# Patient Record
Sex: Female | Born: 1970 | Race: White | Hispanic: No | Marital: Married | Smoking: Never smoker
Health system: Southern US, Community
[De-identification: ages and names within clinical notes are randomized; demographics above are authoritative.]

## PROBLEM LIST (undated history)

## (undated) DIAGNOSIS — D6851 Activated protein C resistance: Secondary | ICD-10-CM

## (undated) DIAGNOSIS — I639 Cerebral infarction, unspecified: Secondary | ICD-10-CM

## (undated) HISTORY — PX: BRAIN SURGERY: SHX531

## (undated) HISTORY — PX: CHOLECYSTECTOMY: SHX55

## (undated) HISTORY — PX: ABDOMINAL SURGERY: SHX537

---

## 2016-01-05 DIAGNOSIS — G4733 Obstructive sleep apnea (adult) (pediatric): Secondary | ICD-10-CM | POA: Insufficient documentation

## 2018-10-21 ENCOUNTER — Telehealth: Payer: Self-pay

## 2018-10-21 ENCOUNTER — Other Ambulatory Visit: Payer: Self-pay

## 2018-10-21 MED ORDER — ESCITALOPRAM OXALATE 20 MG PO TABS: 30.0000 mg | ORAL_TABLET | Freq: Every day | ORAL | 5 refills | Status: DC

## 2018-10-21 NOTE — Telephone Encounter (Signed)
[facility] requesting refill for pt's lexapro 20mg  1.5 tablets daily.   Confirmed with pt a change in pharmacy. Will submit

## 2019-02-24 ENCOUNTER — Encounter: Payer: Self-pay | Admitting: Psychiatry

## 2019-02-24 ENCOUNTER — Other Ambulatory Visit: Payer: Self-pay

## 2019-02-24 ENCOUNTER — Ambulatory Visit (INDEPENDENT_AMBULATORY_CARE_PROVIDER_SITE_OTHER): Payer: 59 | Admitting: Psychiatry

## 2019-02-24 DIAGNOSIS — F3132 Bipolar disorder, current episode depressed, moderate: Secondary | ICD-10-CM | POA: Diagnosis not present

## 2019-02-24 DIAGNOSIS — F411 Generalized anxiety disorder: Secondary | ICD-10-CM | POA: Insufficient documentation

## 2019-02-24 DIAGNOSIS — F88 Other disorders of psychological development: Secondary | ICD-10-CM | POA: Diagnosis not present

## 2019-02-24 DIAGNOSIS — G4733 Obstructive sleep apnea (adult) (pediatric): Secondary | ICD-10-CM

## 2019-02-24 MED ORDER — CARIPRAZINE HCL 3 MG PO CAPS: 3.0000 mg | ORAL_CAPSULE | Freq: Every day | ORAL | 5 refills | Status: DC

## 2019-02-24 MED ORDER — ESCITALOPRAM OXALATE 20 MG PO TABS: 20.0000 mg | ORAL_TABLET | Freq: Every day | ORAL | 5 refills | Status: DC

## 2019-02-24 MED ORDER — LAMOTRIGINE 200 MG PO TABS: 400.0000 mg | ORAL_TABLET | Freq: Every day | ORAL | 5 refills | Status: DC

## 2019-02-24 MED ORDER — BUPROPION HCL ER (XL) 150 MG PO TB24: 450.0000 mg | ORAL_TABLET | Freq: Every day | ORAL | 5 refills | Status: DC

## 2019-02-24 NOTE — Progress Notes (Signed)
[facility]  Patient ID: Madison RheaJodie Henry,  MRN: [id number]  PCP: Patient, No Pcp Per  Date of Evaluation: 02/24/2019 Time spent:20 minutes from 1650 to 1710  Chief Complaint:  Chief Complaint    Depression; Manic Behavior; Anxiety; Fatigue; Obesity      HISTORY/CURRENT STATUS: [name] is provided telemedicine audiovisual appointment session individually at her home residence with consent without collateral in 3628-month evaluation and management of bipolar disorder, generalized anxiety, and neurodevelopmental sequela of neonatal stroke.  She continues to fixate her care in patterns established by Dr. Tomasa Randunningham 10 years ago so that she will only appoint at the office every 9 months.  She has been prescribed Abilify since 06/03/2009 after lack of success with Risperdal.  Abilify has maintained good relief for extremes of mania of which patient is most fearful past consequences manic romantic and risk-taking behavior.  However in the last 2 years, the patient has been more slowed, inactive, gaining weight, low energy, and low interest.  She does not necessarily interpret this as depressed clinically while side effects and depression are evident.  Husband and daughter now confront her to have active enthusiasm and participation in preparing for move to their new home in a couple of weeks.  Patient now requests change in treatment for these problems she has resisted in several years of care by myself.  She did relinquish fixations about treatment 9 months ago by allowing Abilify to be reduced 50% from 30 to 15 mg every morning.  High dosing of medication had been confronted several years attempting to gain the patient's active participation in matching treatment to symptoms for optimal efficacy with least side effects.  She appears to have dopaminergic blunting and slowing of mentation and activity for impoverished more than depressed affect though most both are important.  High-dose  Wellbutrin, Lamictal, Lexapro and as needed Valium do not realize side effects even now after the 50% reduction in Abilify. She apologizes for requesting in the session today to change her medications to help her function actively in the family again patient is a move to a new house.  Sweet tea is her only source of caffeine.  She no longer uses any alcohol and has no history of tobacco or other drugs.  She sees ChiropodistHeather Mask for therapy every 2 weeks on Wednesday that she always considered being blah better than angry but now realizes how she is limited.  She has no current mania, psychosis, suicidality, or delirium.  Depression       The patient presents with depression.  This is a recurrent problem.  The current episode started more than 1 year ago.   The onset quality is sudden.   The problem occurs intermittently.  The problem has been waxing and waning since onset.  Associated symptoms include decreased concentration, fatigue, hopelessness, insomnia, irritable, decreased interest, appetite change, myalgias and sad.     The symptoms are aggravated by social issues, family issues, medication and work stress.  Past treatments include SSRIs - Selective serotonin reuptake inhibitors, other medications and psychotherapy.  Compliance with treatment is good.  Past compliance problems include medical issues, medication issues and difficulty with treatment plan.  Previous treatment provided moderate relief.  Risk factors include family history, major life event, stress and a recent illness.   Past medical history includes physical disability, anxiety, bipolar disorder, depression and mental health disorder.     Pertinent negatives include no chronic illness, no recent illness, no life-threatening condition, no recent psychiatric admission,  no eating disorder, no obsessive-compulsive disorder, no post-traumatic stress disorder, no schizophrenia and no head trauma.   Individual Medical History/ Review of Systems:  Changes? :  Yes she gained 20 pounds approximately 3-1/2 years ago.  Curiously [place] registry does not list any Valium or Ambien from the past.  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  Dr. Tomasa Rand at first appointment noted that she had anxiety and depression as long as she can remember treated with Effexor, BuSpar,, and Ambien.  Subsequent medications from Dr. Tomasa Rand have included Prozac, Depakote, and Risperdal before occasions were well-established by 2011, Abilify reaching 30 mg daily by 2013 reduced to 15 mg 06/17/2018.  Obstructive sleep apnea requires CPAP.  She has had radiculopathies requiring neurology multiple times.This winter she had urolithiasis.  Cholesterol was 212 with LDL cholesterol 133 upper limit of normal 130 mg/dl on 1/61/0960 and maximum value in the last 5 years 147 mg/dL.  Allergies: Patient has no known allergies.  Current Medications:  Current Outpatient Medications:  .  ARIPiprazole (ABILIFY) 15 MG tablet, Take 7.5 mg by mouth daily after breakfast. For 2 weeks then discontinue, Disp: , Rfl:  .  buPROPion (WELLBUTRIN XL) 150 MG 24 hr tablet, Take 3 tablets (450 mg total) by mouth daily after breakfast. 2 weeks then discontinue 03/11/2019, Disp: 90 tablet, Rfl: 5 .  diazepam (VALIUM) 5 MG tablet, Take 5 mg by mouth 2 (two) times daily as needed for anxiety or agitation., Disp: , Rfl:  .  escitalopram (LEXAPRO) 20 MG tablet, Take 1 tablet (20 mg total) by mouth daily after breakfast., Disp: 30 tablet, Rfl: 5 .  lamoTRIgine (LAMICTAL) 200 MG tablet, Take 2 tablets (400 mg total) by mouth at bedtime., Disp: 60 tablet, Rfl: 5 .  cariprazine (VRAYLAR) capsule, Take 1 capsule (3 mg total) by mouth daily., Disp: 30 capsule, Rfl: 5   Medication Side Effects: confusion, dizziness/lightheadedness, fatigue/weakness, hypersomnolence and weight gain from Abilify while Risperdal caused pain all over.  Family Medical/ Social History: Changes? Yes  family history of depression, substance use, and cancer.  Family move to a new home now planned in several weeks.  MENTAL HEALTH EXAM:  There were no vitals taken for this visit.There is no height or weight on file to calculate BMI. Last appointment 06/17/2018 this office included weight 240 pounds, height 65 inches, BMI 39.9, BP 120/84 and HR 78.  General Appearance: Casual, Fairly Groomed, Guarded and Obese  Eye Contact:  Good  Speech:  Clear and Coherent, Normal Rate and Talkative  Volume:  Normal  Mood:  Anxious, Depressed, Dysphoric, Irritable, Worthless and History of manic episodes some sustained.  Affect:  Depressed, Labile, Full Range and Anxious  Thought Process:  Coherent, Goal Directed and Irrelevant  Orientation:  Full (Time, Place, and Person)  Thought Content: Obsessions and Rumination   Suicidal Thoughts:  No  Homicidal Thoughts:  No  Memory:  Immediate;   Good Remote;   Fair  Judgement:  Fair  Insight:  Fair  Psychomotor Activity:  Decreased and Mannerisms and psychomotor retardation  Concentration:  Concentration: Fair and Attention Span: Fair  Recall:  Good  Fund of Knowledge: Good  Language: Good  Assets:  Desire for Improvement Social Support Talents/Skills  ADL's:  Intact  Cognition: WNL  Prognosis:  Fair    DIAGNOSES:    ICD-10-CM   1. Bipolar I disorder, moderate, current or most recent episode depressed, with anxious distress (HCC) F31.32 cariprazine (VRAYLAR) capsule  escitalopram (LEXAPRO) 20 MG tablet    lamoTRIgine (LAMICTAL) 200 MG tablet    buPROPion (WELLBUTRIN XL) 150 MG 24 hr tablet  2. Generalized anxiety disorder F41.1 escitalopram (LEXAPRO) 20 MG tablet    lamoTRIgine (LAMICTAL) 200 MG tablet    buPROPion (WELLBUTRIN XL) 150 MG 24 hr tablet  3. OSA (obstructive sleep apnea) G47.33 buPROPion (WELLBUTRIN XL) 150 MG 24 hr tablet  4. Secondary neurodevelopmental disorder F88 lamoTRIgine (LAMICTAL) 200 MG tablet    buPROPion (WELLBUTRIN XL)  150 MG 24 hr tablet    Receiving Psychotherapy: Yes Heather Mask, LPC at [facility] every 2 weeks  RECOMMENDATIONS: Bipolar depression and generalized anxiety fixate patient in her fear of change of medications, and side effects of Abilify persist despite reduction of the dose 50% to a moderate dose.  She thereby today apologizes for asking for change in medication to facilitate her becoming a part of the family again and helping out with upcoming move to a new home.  Bipolar depression is more evident as Abilify is reduced but side effects persist though not as severe as on 30 mg last September.  Abilify is reduced again to 1/2 of 15 mg total 7.5 mg in morning for 2 weeks then discontinued.  Simultaneously, Vraylar is started 3 mg capsule as 1 every other morning for 3 doses then 1 every morning sent as #30 with [facility]ills to [facility] for bipolar 1 currently depressed with anxious distress history of manic episodes.  Lexapro is reduced 33% relative to poop out type side effect differential and for bipolar stabilization when attempting to stabilize generalized anxiety.  Lexapro is escribed 20 mg every morning #30 with 8 refills to [facility] and bipolar.  Wellbutrin 150 mg XL taking 3 tablets total 450 mg  XL every morning for bipolar depression, generalized anxiety, and sleep apnea is sent as #90 [facility]  She continues Lamictal 200 mg taking 2 every bedtime escribed as #60 with 8 refills to [facility] for bipolar disorder.  Valium 5 mg twice daily as needed was last prescribed as #180 with no refill on 08/20/2017 not certain it was filled per [place] registry.  She relinquishes her long-term schedule of 50-month office appointments agreeing to follow-up in 6 months or return sooner if willing.  Psychoeducation is provided on medications especially the new medication Vraylar along with psychosupportive therapy for prevention and monitoring, safety hygiene, and  crisis plans if needed relative to warnings and risks of diagnoses and medications.   Virtual Visit via Video Note  I connected with Riyan Henry on 02/24/19 at  4:40 PM EDT by a video enabled telemedicine application and verified that I am speaking with the correct person using two identifiers.  Location: Patient: Individually at family residence Provider: [facility] office   I discussed the limitations of evaluation and management by telemedicine and the availability of in person appointments. The patient expressed understanding and agreed to proceed.  History of Present Illness:  61-month evaluation and management address bipolar disorder, generalized anxiety, and neurodevelopmental sequela of neonatal stroke. The patient has been more slowed, inactive, gaining weight, low energy, and low interest.  She does not necessarily interpret this as depressed clinically while side effects and depression are evident.  Husband and daughter now confront her to have active enthusiasm and participation in preparing for move to their new home in a couple of weeks.  Patient now requests change in treatment for these  problems she has resisted in several years of care by myself.   Observations/Objective: Mood:  Anxious, Depressed, Dysphoric, Irritable, Worthless and History of manic episodes some sustained.  Affect:  Depressed, Labile, Full Range and Anxious  Thought Process:  Coherent, Goal Directed and Irrelevant  Orientation:  Full (Time, Place, and Person)  Thought Content: Obsessions and Rumination    Assessment and Plan: Abilify is reduced again to 1/2 of 15 mg total 7.5 mg in morning for 2 weeks then discontinued.  Simultaneously, Vraylar is started 3 mg capsule as 1 every other morning for 3 doses then 1 every morning sent as #30 with 8 refills to [facility] for bipolar 1 currently depressed with anxious distress history of manic episodes.  Lexapro is reduced 33%  relative to poop out type side effect differential and for bipolar stabilization when attempting to stabilize generalized anxiety.  Lexapro is escribed 20 mg every morning #30 with 8 refills to [facility] and bipolar.  Wellbutrin 150 mg XL taking 3 tablets total 450 mg  XL every morning for bipolar depression, generalized anxiety, and sleep apnea is sent as #90 with [facility]  She continues Lamictal 200 mg taking 2 every bedtime escribed as #60 with 8 refills to [facility] for bipolar disorder.  Valium 5 mg twice daily as needed was last prescribed as #180 with no refill on 08/20/2017 not certain it was filled per [place] registry  Follow Up Instructions: Follow-up in 6 months or return sooner if willing.  Psychoeducation is provided on medications especially the new medication Vraylar along with psychosupportive therapy for prevention and monitoring, safety hygiene, and crisis plans if needed relative to warnings and risks of diagnoses and medications.    I discussed the assessment and treatment plan with the patient. The patient was provided an opportunity to ask questions and all were answered. The patient agreed with the plan and demonstrated an understanding of the instructions.   The patient was advised to call back or seek an in-person evaluation if the symptoms worsen or if the condition fails to improve as anticipated.  I provided 20 minutes of non-face-to-face time during this encounter. [place] WebEx meeting #[id number] Meeting password: [email or web address]  Chauncey Mann, MD   Chauncey Mann, MD

## 2019-02-24 NOTE — Patient Instructions (Signed)
Lexapro dosed 20 to 30 mg every mornings is reduced to 20 mg every morning for poop out amotivation, lethargy, and apathy.  Valium remains 5 mg twice daily if needed having current supply though not recorded by [place] registry nor was Ambien of the past.  Lamictal 400 mg nightly and Wellbutrin 450 mg XL every morning are continued without change.  Abilify 15 mg tablet is reduced to 1/2 tablet total 7.5 mg every morning for 2 weeks then stopped for side effects of amotivation, apathy, inactivity, anhedonia, and weight gain.  Vraylar 3 mg capsule will be started as 1 capsule every other day for 3 doses then 1 capsule of 3 mg daily every morning replacing Abilify for bipolar disorder.

## 2019-02-25 ENCOUNTER — Telehealth: Payer: Self-pay

## 2019-02-25 NOTE — Telephone Encounter (Signed)
Prior authorization approved through [place] for Vraylar 3mg 

## 2019-03-15 ENCOUNTER — Telehealth: Payer: Self-pay | Admitting: Psychiatry

## 2019-03-15 NOTE — Telephone Encounter (Signed)
Pt said at her last vist Dr. Creig Hines changed her meds. Pt is not feeling well and is having trouble breathing, and feeling overwhelmed. Pt wants to know if she should increase her doses.

## 2019-03-15 NOTE — Telephone Encounter (Signed)
Over activation is likely Vraylar side effect rather than simple anxiety, though her diazepam one half of the 5 mg may help either be more comfortable.  She may well adapt to the activation but will monitor for akathisia formally.  Dose of Vraylar could be reduced to 1.5 mg daily, though we will assess whether likely to be worthwhile by for the next week or two reducing frequency of 3 mg Vraylar capsule to every other day until she gets more time off of her Abilify, though she feels better overall with the change from Abilify to [place] already accomplishing many of the goals for which reason she requested a change.

## 2019-03-20 ENCOUNTER — Emergency Department (HOSPITAL_BASED_OUTPATIENT_CLINIC_OR_DEPARTMENT_OTHER)
Admission: EM | Admit: 2019-03-20 | Discharge: 2019-03-20 | Disposition: A | Discharge status: Home / Self Care | Payer: 59 | Attending: Emergency Medicine | Admitting: Emergency Medicine

## 2019-03-20 ENCOUNTER — Encounter (HOSPITAL_BASED_OUTPATIENT_CLINIC_OR_DEPARTMENT_OTHER): Payer: Self-pay | Admitting: Emergency Medicine

## 2019-03-20 ENCOUNTER — Other Ambulatory Visit: Payer: Self-pay

## 2019-03-20 DIAGNOSIS — M79602 Pain in left arm: Secondary | ICD-10-CM | POA: Diagnosis present

## 2019-03-20 DIAGNOSIS — M5412 Radiculopathy, cervical region: Secondary | ICD-10-CM | POA: Insufficient documentation

## 2019-03-20 DIAGNOSIS — Z79899 Other long term (current) drug therapy: Secondary | ICD-10-CM | POA: Insufficient documentation

## 2019-03-20 MED ORDER — PREDNISONE 10 MG PO TABS: 20.0000 mg | ORAL_TABLET | Freq: Two times a day (BID) | ORAL | 0 refills | Status: AC

## 2019-03-20 MED ORDER — HYDROCODONE-ACETAMINOPHEN 5-325 MG PO TABS
2.0000 | ORAL_TABLET | Freq: Once | ORAL | Status: AC
  Administered 2019-03-20: 2 via ORAL
  Filled 2019-03-20: qty 2

## 2019-03-20 MED ORDER — HYDROCODONE-ACETAMINOPHEN 5-325 MG PO TABS: 1.0000 | ORAL_TABLET | Freq: Four times a day (QID) | ORAL | 0 refills | Status: AC | PRN

## 2019-03-20 MED ORDER — KETOROLAC TROMETHAMINE 60 MG/2ML IM SOLN
60.0000 mg | Freq: Once | INTRAMUSCULAR | Status: AC
  Administered 2019-03-20: 60 mg via INTRAMUSCULAR
  Filled 2019-03-20: qty 2

## 2019-03-20 NOTE — ED Provider Notes (Signed)
[facility] EMERGENCY DEPARTMENT Provider Note   CSN: [id number] Arrival date & time: 03/20/19  1139     History   Chief Complaint Chief Complaint  Patient presents with  . Arm Pain    HPI Sansa Spurgeon-Yocum is a 48 y.o. female.     Patient is a 48 year old female with history of anxiety, obstructive sleep apnea, and what she describes as a stroke while she was an infant.  She presents with complaints of left arm pain.  She has a history of previous episodes of cervical radiculopathy and believes that this is a flareup of the same.  She feels as though she slept wrong last night.  She is experiencing severe pain that radiates down the back of her left arm and into her hand.  She denies any weakness.  The history is provided by the patient.  Arm Pain This is a recurrent problem. The current episode started 3 to 5 hours ago. The problem occurs constantly. The problem has been rapidly worsening. Exacerbated by: Movement and palpation. The symptoms are relieved by rest. She has tried nothing for the symptoms.    No past medical history on file.  Patient Active Problem List   Diagnosis Date Noted  . Bipolar I disorder, moderate, current or most recent episode depressed, with anxious distress (HCC) 02/24/2019  . Generalized anxiety disorder 02/24/2019  . 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 OSA (obstructive sleep apnea) 01/05/2016    Past Surgical History:  Procedure Laterality Date  . ABDOMINAL SURGERY     3 c sections  . CHOLECYSTECTOMY       OB History   No obstetric history on file.      Home Medications    Prior to Admission medications   Medication Sig Start Date End Date Taking? Authorizing Provider  ARIPiprazole (ABILIFY) 15 MG tablet Take 7.5 mg by mouth daily after breakfast. For 2 weeks then discontinue 02/21/19   [provider]  buPROPion (WELLBUTRIN XL) 150 MG 24 hr tablet Take 3 tablets (450 mg total) by mouth daily  after breakfast. 2 weeks then discontinue 03/11/2019 02/24/19   Chauncey MannJennings, Glenn E, MD  cariprazine (VRAYLAR) capsule Take 1 capsule (3 mg total) by mouth daily. 02/24/19   Chauncey MannJennings, Glenn E, MD  diazepam (VALIUM) 5 MG tablet Take 5 mg by mouth 2 (two) times daily as needed for anxiety or agitation.    [provider]  escitalopram (LEXAPRO) 20 MG tablet Take 1 tablet (20 mg total) by mouth daily after breakfast. 02/24/19   Chauncey MannJennings, Glenn E, MD  lamoTRIgine (LAMICTAL) 200 MG tablet Take 2 tablets (400 mg total) by mouth at bedtime. 02/24/19   Chauncey MannJennings, Glenn E, MD    Family History No family history on file.  Social History Social History   Tobacco Use  . Smoking status: Never Smoker  . Smokeless tobacco: Never Used  Substance Use Topics  . Alcohol use: Not Currently  . Drug use: Never     Allergies   Patient has no known allergies.   Review of Systems Review of Systems  All other systems reviewed and are negative.    Physical Exam Updated Vital Signs BP (!) 132/100 (BP Location: Right Arm)   Pulse 91   Temp 98.6 F (37 C) (Oral)   Resp 20   Ht 5\' 5"  (1.651 m)   Wt 106.6 kg   LMP 03/13/2019 (Exact Date)   SpO2 98%   BMI 39.11 kg/m   Physical Exam  Vitals signs and nursing note reviewed.  Constitutional:      General: She is not in acute distress.    Appearance: Normal appearance. She is not ill-appearing.  HENT:     Head: Normocephalic.  Neck:     Musculoskeletal: Normal range of motion. No neck rigidity or muscular tenderness.  Pulmonary:     Effort: Pulmonary effort is normal.  Musculoskeletal:     Comments: There is tenderness to palpation in the posterior aspect of the left shoulder and posterior right arm.  Ulnar and radial pulses are palpable.  She is able to flex, extend, and oppose all fingers.  Sensation is intact throughout the entire hand.  Neurological:     Mental Status: She is alert.      ED Treatments / Results  Labs (all labs ordered  are listed, but only abnormal results are displayed) Labs Reviewed - No data to display  EKG    Radiology No results found.  Procedures Procedures (including critical care time)  Medications Ordered in ED Medications  ketorolac (TORADOL) injection 60 mg (has no administration in time range)  HYDROcodone-acetaminophen (NORCO/VICODIN) 5-325 MG per tablet 2 tablet (2 tablets Oral Given 03/20/19 1218)     Initial Impression / Assessment and Plan / ED Course  I have reviewed the triage vital signs and the nursing notes.  Pertinent labs & imaging results that were available during my care of the patient were reviewed by me and considered in my medical decision making (see chart for details).  Patient given Toradol and Norco here in the ER.  She will be discharged with prednisone and Norco.  She has a neurologist who has seen her in the past for this issue and I have advised her to follow-up with them this week if not improving.  Final Clinical Impressions(s) / ED Diagnoses   Final diagnoses:  None    ED Discharge Orders    None       Veryl Speak, MD 03/20/19 1222

## 2019-03-20 NOTE — ED Notes (Signed)
Patient verbalizes understanding of discharge instructions. Opportunity for questioning and answers were provided. Armband removed by staff, pt discharged from ED.  

## 2019-03-20 NOTE — ED Triage Notes (Signed)
Patient here with c/o 7/10 left arm pain that started on Friday. Able to move extremity with no issue, no weakness. Hx of stroke on left side when she was a baby.

## 2019-03-20 NOTE — Discharge Instructions (Addendum)
Prednisone as prescribed.  Hydrocodone as prescribed as needed for pain.  Follow-up with your primary doctor/neurologist if not improving in the next few days.

## 2019-04-29 ENCOUNTER — Other Ambulatory Visit: Payer: Self-pay | Admitting: Psychiatry

## 2019-04-29 DIAGNOSIS — F3132 Bipolar disorder, current episode depressed, moderate: Secondary | ICD-10-CM

## 2019-04-29 DIAGNOSIS — F411 Generalized anxiety disorder: Secondary | ICD-10-CM

## 2019-04-30 ENCOUNTER — Other Ambulatory Visit: Payer: Self-pay | Admitting: Psychiatry

## 2019-04-30 DIAGNOSIS — F411 Generalized anxiety disorder: Secondary | ICD-10-CM

## 2019-04-30 DIAGNOSIS — F3132 Bipolar disorder, current episode depressed, moderate: Secondary | ICD-10-CM

## 2019-07-05 ENCOUNTER — Other Ambulatory Visit: Payer: Self-pay

## 2019-07-05 DIAGNOSIS — F88 Other disorders of psychological development: Secondary | ICD-10-CM

## 2019-07-05 DIAGNOSIS — F411 Generalized anxiety disorder: Secondary | ICD-10-CM

## 2019-07-05 DIAGNOSIS — F3132 Bipolar disorder, current episode depressed, moderate: Secondary | ICD-10-CM

## 2019-07-05 DIAGNOSIS — G4733 Obstructive sleep apnea (adult) (pediatric): Secondary | ICD-10-CM

## 2019-07-05 MED ORDER — BUPROPION HCL ER (XL) 150 MG PO TB24: 450.0000 mg | ORAL_TABLET | Freq: Every day | ORAL | 0 refills | Status: DC

## 2019-09-25 ENCOUNTER — Emergency Department (HOSPITAL_BASED_OUTPATIENT_CLINIC_OR_DEPARTMENT_OTHER): Payer: Managed Care, Other (non HMO)

## 2019-09-25 ENCOUNTER — Other Ambulatory Visit: Payer: Self-pay

## 2019-09-25 ENCOUNTER — Encounter (HOSPITAL_BASED_OUTPATIENT_CLINIC_OR_DEPARTMENT_OTHER): Payer: Self-pay | Admitting: Emergency Medicine

## 2019-09-25 ENCOUNTER — Emergency Department (HOSPITAL_BASED_OUTPATIENT_CLINIC_OR_DEPARTMENT_OTHER)
Admission: EM | Admit: 2019-09-25 | Discharge: 2019-09-25 | Disposition: A | Discharge status: Home / Self Care | Payer: Managed Care, Other (non HMO) | Attending: Emergency Medicine | Admitting: Emergency Medicine

## 2019-09-25 DIAGNOSIS — Z20822 Contact with and (suspected) exposure to covid-19: Secondary | ICD-10-CM | POA: Insufficient documentation

## 2019-09-25 DIAGNOSIS — R0602 Shortness of breath: Secondary | ICD-10-CM | POA: Diagnosis present

## 2019-09-25 DIAGNOSIS — B349 Viral infection, unspecified: Secondary | ICD-10-CM

## 2019-09-25 DIAGNOSIS — Z8673 Personal history of transient ischemic attack (TIA), and cerebral infarction without residual deficits: Secondary | ICD-10-CM | POA: Diagnosis not present

## 2019-09-25 DIAGNOSIS — Z79899 Other long term (current) drug therapy: Secondary | ICD-10-CM | POA: Diagnosis not present

## 2019-09-25 HISTORY — DX: Cerebral infarction, unspecified: I63.9

## 2019-09-25 HISTORY — DX: Activated protein C resistance: D68.51

## 2019-09-25 LAB — CBC WITH DIFFERENTIAL/PLATELET
Abs Immature Granulocytes: 0.01 10*3/uL (ref 0.00–0.07)
Basophils Absolute: 0 10*3/uL (ref 0.0–0.1)
Basophils Relative: 0 %
Eosinophils Absolute: 0.2 10*3/uL (ref 0.0–0.5)
Eosinophils Relative: 3 %
HCT: 41.1 % (ref 36.0–46.0)
Hemoglobin: 13.8 g/dL (ref 12.0–15.0)
Immature Granulocytes: 0 %
Lymphocytes Relative: 27 %
Lymphs Abs: 1.8 10*3/uL (ref 0.7–4.0)
MCH: 30.6 pg (ref 26.0–34.0)
MCHC: 33.6 g/dL (ref 30.0–36.0)
MCV: 91.1 fL (ref 80.0–100.0)
Monocytes Absolute: 0.5 10*3/uL (ref 0.1–1.0)
Monocytes Relative: 7 %
Neutro Abs: 4.2 10*3/uL (ref 1.7–7.7)
Neutrophils Relative %: 63 %
Platelets: 242 10*3/uL (ref 150–400)
RBC: 4.51 MIL/uL (ref 3.87–5.11)
RDW: 12.9 % (ref 11.5–15.5)
WBC: 6.7 10*3/uL (ref 4.0–10.5)
nRBC: 0 % (ref 0.0–0.2)

## 2019-09-25 LAB — BASIC METABOLIC PANEL
Anion gap: 10 (ref 5–15)
BUN: 12 mg/dL (ref 6–20)
CO2: 24 mmol/L (ref 22–32)
Calcium: 9.1 mg/dL (ref 8.9–10.3)
Chloride: 104 mmol/L (ref 98–111)
Creatinine, Ser: 0.89 mg/dL (ref 0.44–1.00)
GFR calc Af Amer: >60 mL/min (ref >60)
GFR calc non Af Amer: >60 mL/min (ref >60)
Glucose, Bld: 94 mg/dL (ref 70–99)
Potassium: 4.1 mmol/L (ref 3.5–5.1)
Sodium: 138 mmol/L (ref 135–145)

## 2019-09-25 LAB — SARS CORONAVIRUS 2 AG (30 MIN TAT): SARS Coronavirus 2 Ag: NEGATIVE

## 2019-09-25 LAB — SARS CORONAVIRUS 2 (TAT 6-24 HRS): SARS Coronavirus 2: NEGATIVE

## 2019-09-25 MED ORDER — IOHEXOL 350 MG/ML SOLN
100.0000 mL | Freq: Once | INTRAVENOUS | Status: AC | PRN
  Administered 2019-09-25: 75 mL via INTRAVENOUS

## 2019-09-25 NOTE — ED Notes (Signed)
Pt's daughter tested positive for Covid yesterday. Pt states she has had diarrhea for 3 days with heaviness in chest, denies SOB, but states she has felt "wheezy". 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

## 2019-09-25 NOTE — Discharge Instructions (Addendum)
Your Covid test is pending  °

## 2019-09-25 NOTE — ED Triage Notes (Signed)
SOB, fatigue, headache, diarrhea x 3 days. Daughter tested positive for COVID

## 2019-09-25 NOTE — ED Provider Notes (Signed)
[facility] EMERGENCY DEPARTMENT Provider Note   CSN: [id number] Arrival date & time: 09/25/19  0856     History Chief Complaint  Patient presents with  . COVID symptoms    Madison Henry is a 49 y.o. female.  The history is provided by the patient. No language interpreter was used.  Cough Cough characteristics:  Non-productive Sputum characteristics:  Nondescript Severity:  Moderate Onset quality:  Gradual Duration:  3 days Timing:  Constant Chronicity:  New Smoker: no   Context: sick contacts   Worsened by:  Nothing Ineffective treatments:  None tried Associated symptoms: chest pain and shortness of breath    Pt reports her daughter tested positive for covid.  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 Pt has Factor 5 Leiden.  Pt reports she was on birth control and had traveled before previous clots. Pt is concerned about having a pe.     Past Medical History:  Diagnosis Date  . Factor 5 Leiden mutation, heterozygous (HCC)   . Stroke [facility])     Patient Active Problem List   Diagnosis Date Noted  . Bipolar I disorder, moderate, current or most recent episode depressed, with anxious distress (HCC) 02/24/2019  . Generalized anxiety disorder 02/24/2019  . 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 OSA (obstructive sleep apnea) 01/05/2016    Past Surgical History:  Procedure Laterality Date  . ABDOMINAL SURGERY     3 c sections  . CHOLECYSTECTOMY       OB History   No obstetric history on file.     No family history on file.  Social History   Tobacco Use  . Smoking status: Never Smoker  . Smokeless tobacco: Never Used  Substance Use Topics  . Alcohol use: Not Currently  . Drug use: Never    Home Medications Prior to Admission medications   Medication Sig Start Date End Date Taking? Authorizing Provider  ARIPiprazole (ABILIFY) 15 MG tablet Take 7.5 mg by mouth daily after breakfast. For 2 weeks then discontinue 02/21/19    [provider]  buPROPion (WELLBUTRIN XL) 150 MG 24 hr tablet Take 3 tablets (450 mg total) by mouth daily after breakfast. 2 weeks then discontinue 03/11/2019 07/05/19   Chauncey Mann, MD  cariprazine (VRAYLAR) capsule Take 1 capsule (3 mg total) by mouth daily. 02/24/19   Chauncey Mann, MD  diazepam (VALIUM) 5 MG tablet Take 5 mg by mouth 2 (two) times daily as needed for anxiety or agitation.    [provider]  escitalopram (LEXAPRO) 20 MG tablet TAKE 1 TABLET (20 MG TOTAL) BY MOUTH DAILY AFTER BREAKFAST. 05/02/19   Chauncey Mann, MD  HYDROcodone-acetaminophen (NORCO) 5-325 MG tablet Take 1-2 tablets by mouth every 6 (six) hours as needed. 03/20/19   Geoffery Lyons, MD  lamoTRIgine (LAMICTAL) 200 MG tablet Take 2 tablets (400 mg total) by mouth at bedtime. 02/24/19   Chauncey Mann, MD  predniSONE (DELTASONE) 10 MG tablet Take 2 tablets (20 mg total) by mouth 2 (two) times daily. 03/20/19   Geoffery Lyons, MD    Allergies    Patient has no known allergies.  Review of Systems   Review of Systems  Respiratory: Positive for cough and shortness of breath.   Cardiovascular: Positive for chest pain.  All other systems reviewed and are negative.   Physical Exam Updated Vital Signs BP (!) 135/109 (BP Location: Right Arm)   Pulse (!) 105   Temp 99.2 F (37.3  C) (Oral)   Resp 18   Ht 5\' 5"  (1.651 m)   Wt 106.6 kg   SpO2 96%   BMI 39.11 kg/m   Physical Exam Vitals and nursing note reviewed.  Constitutional:      Appearance: She is well-developed.  HENT:     Head: Normocephalic.     Right Ear: Tympanic membrane normal.     Nose: Nose normal.     Mouth/Throat:     Mouth: Mucous membranes are moist.  Eyes:     Pupils: Pupils are equal, round, and reactive to light.  Cardiovascular:     Rate and Rhythm: Tachycardia present.  Pulmonary:     Effort: Pulmonary effort is normal.  Abdominal:     General: There is no distension.  Musculoskeletal:         General: Normal range of motion.     Cervical back: Normal range of motion.  Skin:    General: Skin is warm.  Neurological:     Mental Status: She is alert and oriented to person, place, and time.  Psychiatric:        Mood and Affect: Mood normal.     ED Results / Procedures / Treatments   Labs (all labs ordered are listed, but only abnormal results are displayed) Labs Reviewed  SARS CORONAVIRUS 2 AG (30 MIN TAT)  SARS CORONAVIRUS 2 (TAT 6-24 HRS)  CBC WITH DIFFERENTIAL/PLATELET  BASIC METABOLIC PANEL    EKG EKG Interpretation  Date/Time:  Sunday September 25 2019 09:57:50 EST Ventricular Rate:  80 PR Interval:    QRS Duration: 105 QT Interval:  382 QTC Calculation: 441 R Axis:   93 Text Interpretation: Sinus rhythm Borderline right axis deviation Confirmed by Davonna Belling [phone]) on 09/25/2019 10:03:24 AM   Radiology CT Angio Chest PE W and/or Wo Contrast  Result Date: 09/25/2019 CLINICAL DATA:  Short of breath. EXAM: CT ANGIOGRAPHY CHEST WITH CONTRAST TECHNIQUE: Multidetector CT imaging of the chest was performed using the standard protocol during bolus administration of intravenous contrast. Multiplanar CT image reconstructions and MIPs were obtained to evaluate the vascular anatomy. CONTRAST:  1mL OMNIPAQUE IOHEXOL 350 MG/ML SOLN COMPARISON:  None FINDINGS: Cardiovascular: Satisfactory opacification of the pulmonary arteries to the segmental level. No evidence of pulmonary embolism. Normal heart size. No pericardial effusion. Mediastinum/Nodes: No enlarged mediastinal, hilar, or axillary lymph nodes. Thyroid gland, trachea, and esophagus demonstrate no significant findings. Lungs/Pleura: Lungs are clear. No pleural effusion or pneumothorax. Upper Abdomen: No acute abnormality. Cholecystectomy. Musculoskeletal: Mild thoracic spondylosis. No acute or significant osseous findings. Review of the MIP images confirms the above findings. IMPRESSION: 1. No evidence for acute  pulmonary embolus. 2. No active cardiopulmonary abnormalities. Electronically Signed   By: Kerby Moors M.D.   On: 09/25/2019 11:17    Procedures Procedures (including critical care time)  Medications Ordered in ED Medications  iohexol (OMNIPAQUE) 350 MG/ML injection 100 mL (75 mLs Intravenous Contrast Given 09/25/19 1057)    ED Course  I have reviewed the triage vital signs and the nursing notes.  Pertinent labs & imaging results that were available during my care of the patient were reviewed by me and considered in my medical decision making (see chart for details).    MDM Rules/Calculators/A&P                      MDM  POC covid is negative.  Send out ordered.  I suspect pt will be positive as she has  a child who has covid.  Ct scan obtained.  No pe.  Pt is taking an aspirin a day.  Pt advised to follow up with her MD.  Final Clinical Impression(s) / ED Diagnoses Final diagnoses:  Viral illness    Rx / DC Orders ED Discharge Orders    None    An After Visit Summary was printed and given to the patient.   Osie Cheeks 09/25/19 1136    Benjiman Core, MD 09/25/19 1501

## 2019-09-26 ENCOUNTER — Other Ambulatory Visit: Payer: Self-pay | Admitting: Psychiatry

## 2019-09-26 DIAGNOSIS — F88 Other disorders of psychological development: Secondary | ICD-10-CM

## 2019-09-26 DIAGNOSIS — F411 Generalized anxiety disorder: Secondary | ICD-10-CM

## 2019-09-26 DIAGNOSIS — F3132 Bipolar disorder, current episode depressed, moderate: Secondary | ICD-10-CM

## 2019-09-26 NOTE — Telephone Encounter (Signed)
Last apt in June 2020

## 2019-10-03 ENCOUNTER — Other Ambulatory Visit: Payer: Self-pay | Admitting: Psychiatry

## 2019-10-03 DIAGNOSIS — F3132 Bipolar disorder, current episode depressed, moderate: Secondary | ICD-10-CM

## 2019-10-19 ENCOUNTER — Other Ambulatory Visit: Payer: Self-pay | Admitting: Psychiatry

## 2019-10-19 DIAGNOSIS — F411 Generalized anxiety disorder: Secondary | ICD-10-CM

## 2019-10-19 DIAGNOSIS — F88 Other disorders of psychological development: Secondary | ICD-10-CM

## 2019-10-19 DIAGNOSIS — F3132 Bipolar disorder, current episode depressed, moderate: Secondary | ICD-10-CM

## 2019-11-08 ENCOUNTER — Other Ambulatory Visit: Payer: Self-pay | Admitting: Psychiatry

## 2019-11-08 DIAGNOSIS — F411 Generalized anxiety disorder: Secondary | ICD-10-CM

## 2019-11-08 DIAGNOSIS — F88 Other disorders of psychological development: Secondary | ICD-10-CM

## 2019-11-08 DIAGNOSIS — F3132 Bipolar disorder, current episode depressed, moderate: Secondary | ICD-10-CM

## 2019-11-08 NOTE — Telephone Encounter (Signed)
Last appointment 02/24/2019 now returning 8 months later having appointment tomorrow needing Lamictal tonight 200 mg taking 2 every bedtime for a total of 400 mg #60 with no refill sent to [facility] on [address]

## 2019-11-08 NOTE — Telephone Encounter (Signed)
Apt tomorrow 02/17

## 2019-11-09 ENCOUNTER — Encounter: Payer: Self-pay | Admitting: Psychiatry

## 2019-11-09 ENCOUNTER — Ambulatory Visit (INDEPENDENT_AMBULATORY_CARE_PROVIDER_SITE_OTHER): Payer: Managed Care, Other (non HMO) | Admitting: Psychiatry

## 2019-11-09 VITALS — Ht 65.0 in | Wt 232.0 lb

## 2019-11-09 DIAGNOSIS — F3132 Bipolar disorder, current episode depressed, moderate: Secondary | ICD-10-CM | POA: Diagnosis not present

## 2019-11-09 DIAGNOSIS — F88 Other disorders of psychological development: Secondary | ICD-10-CM

## 2019-11-09 MED ORDER — METHYLPHENIDATE HCL 10 MG PO TABS: 10.0000 mg | ORAL_TABLET | Freq: Two times a day (BID) | ORAL | 0 refills | Status: DC

## 2019-11-09 NOTE — Progress Notes (Signed)
[facility]  Patient ID: Jamiah Homeyer,  MRN: [id number]  PCP: Elijio Miles., MD  Date of Evaluation: 11/09/2019 Time spent:20 minutes from 1445 to 1505  Chief Complaint:  Chief Complaint    Depression; Manic Behavior; Anxiety; Fatigue; Stress      HISTORY/CURRENT STATUS: Lennox Laity is provided telemedicine audiovisual appointment session as she also required last appointment, declining the video camera due to anxiety likely also for her neonatal stroke with left hemiparesis, phone to phone 20 minutes with consent with epic collateral for psychiatric interview and exam in 71-month evaluation and management of bipolar depression, generalized anxiety, and 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  Patient's fixation on follow-up every 8 months to 9 months from previous psychiatrist Dr. Tomasa Rand continues despite attempt to shorten to 6 months last appointment.  She presents  exhausted despair and anxious posture now relative to cervical radiculopathy possibly needing surgery as well as family stress including husband's mental illness that feels protracted to her as she waits months to discuss.  However she consolidates her distress in the session today to still being fatigued despite change of Abilify to Vraylar at last appointment providing only modest improvement.  She continues her other medications without change and her psychotherapy with Candis Schatz for mental health.  She formulates that she lacks energy and initiative to work on other mental health changes until she finds some relief of fatigue more directly.  We process understanding the mutually reinforcing and disengaging steps of recovery from depressive and anxious fixations, she considers that too much to work on now and when she feels better and can gather more time from her daily responsibilities.  She has no mania, suicidality, psychosis or delirium.  Depression  The patient presents with depression as a  recurrent problem concluded bipolar by patient and previous psychiatrist from a time of manic hypersexual and disinhibited behavior.  The current episode started more than 1 year ago.   The onset quality is sudden.   The problem occurs intermittently.  The problem has been waxing and waning since onset.  Associated symptoms include decreased concentration, fatigue, decreased energy, hopelessness, insomnia, irritable, decreased interest, appetite change, myalgias and sad.     The symptoms are aggravated by social issues, family issues, medication and work stress.  Past treatments include SSRIs - Selective serotonin reuptake inhibitors, other medications and psychotherapy.  Compliance with treatment is good.  Past compliance problems include medical issues, medication issues and difficulty with treatment plan.  Previous treatment provided moderate relief.  Risk factors include family history, major life event, stress and a recent illness.   Past medical history includes physical disability, anxiety, bipolar disorder, depression and mental health disorder.     Pertinent negatives include no chronic illness, no recent illness, no life-threatening condition, no recent psychiatric admission, no eating disorder, no obsessive-compulsive disorder, no post-traumatic stress disorder, no schizophrenia and no head trauma.  Individual Medical History/ Review of Systems: Changes? :Yes  including being in the ED January 3 herself for viral illness complaint having CT chest, labs, EKG, and SARS while having General medical exam in October by GYN with labs as LDL cholesterol still elevated 139 mg/dL with hemoglobin M6Q normal.  LIPID PROFILE (07/20/2019 9:28 AM EDT) LIPID PROFILE (07/20/2019 9:28 AM EDT)  Component Value Ref Range Performed At Pathologist Signature  Total Cholesterol 210 (H) 25 - 199 MG/DL [facility]   Triglycerides 109 10 - 150 MG/DL [facility]   HDL Cholesterol 49 35 -  135  MG/DL [facility]   LDL Cholesterol Calculated 139 Comment:  ATP III Classification (LDL):  <100    mg/dL     Optimal  332 - 951  mg/dL     Near or Above Optimal  130 - 159  mg/dL     Borderline High  884-166   mg/dL     High  >063    mg/dL     Very High   ATP III Classification (LDL):    < 100    mg/dL   Optimal   016 - 010  mg/dL   Near or Above Optimal   130 - 159  mg/dL   Borderline High   932 - 189  mg/dL   High    > 355    mg/dL   Very High  MG/DL [facility]    Hemoglobin A1C (07/20/2019 9:28 AM EDT) Hemoglobin A1C (07/20/2019 9:28 AM EDT)  Component Value Ref Range Performed At Pathologist Signature  HEMOGLOBIN A1C 5.3 Comment:  Normal:    Less than 5.7% Prediabetes: 5.7% to 6.4% Diabetes:   Greater than 6.4% <5.7 % [facility]    TSH, 3rd Generation (07/20/2019 9:28 AM EDT) TSH, 3rd Generation (07/20/2019 9:28 AM EDT)  Component Value Ref Range Performed At Pathologist Signature  TSH 1.580 0.450 - 5.330 UIU/ML [facility]    EKG: EKG Interpretation  Date/Time:                  Sunday September 25 2019 09:57:50 EST Ventricular Rate:         80 PR Interval:                   QRS Duration: 105 QT Interval:                 382 QTC Calculation:        441 R Axis:                         93  Text Interpretation:      Sinus rhythm Borderline right axis deviation Confirmed by Benjiman Core [id number]) on 09/25/2019 10:03:24 AM  Ref Range & Units 1 mo ago  Sodium 135 - 145 mmol/L 138   Potassium 3.5 - 5.1 mmol/L 4.1   Chloride 98 - 111 mmol/L 104   CO2 22 - 32 mmol/L 24   Glucose, Bld 70 - 99 mg/dL 94   BUN 6 - 20 mg/dL 12   Creatinine, Ser 2.54 - 1.00 mg/dL 2.70   Calcium 8.9 - 62.3 mg/dL 9.1   GFR calc non Af Amer >60 mL/min >60   GFR calc Af Amer >60 mL/min >60   Anion gap 5 - 15 10     Allergies: Patient has no known  allergies.  Current Medications:  Current Outpatient Medications:  .  buPROPion (WELLBUTRIN XL) 150 MG 24 hr tablet, Take 3 tablets (450 mg total) by mouth daily after breakfast. 2 weeks then discontinue 03/11/2019, Disp: 270 tablet, Rfl: 0 .  diazepam (VALIUM) 5 MG tablet, Take 5 mg by mouth 2 (two) times daily as needed for anxiety or agitation., Disp: , Rfl:  .  escitalopram (LEXAPRO) 20 MG tablet, TAKE 1 TABLET (20 MG TOTAL) BY MOUTH DAILY AFTER BREAKFAST., Disp: 90 tablet, Rfl: 1 .  HYDROcodone-acetaminophen (NORCO) 5-325 MG tablet, Take 1-2 tablets by mouth every 6 (six) hours as needed., Disp:  15 tablet, Rfl: 0 .  lamoTRIgine (LAMICTAL) 200 MG tablet, TAKE 2 TABLETS (400 MG TOTAL) BY MOUTH AT BEDTIME., Disp: 60 tablet, Rfl: 0 .  methylphenidate (RITALIN) 10 MG tablet, Take 1 tablet (10 mg total) by mouth 2 (two) times daily with breakfast and lunch., Disp: 60 tablet, Rfl: 0 .  [START ON 12/09/2019] methylphenidate (RITALIN) 10 MG tablet, Take 1 tablet (10 mg total) by mouth 2 (two) times daily with breakfast and lunch., Disp: 60 tablet, Rfl: 0 .  predniSONE (DELTASONE) 10 MG tablet, Take 2 tablets (20 mg total) by mouth 2 (two) times daily., Disp: 20 tablet, Rfl: 0 .  VRAYLAR capsule, TAKE 1 CAPSULE (3 MG TOTAL) BY MOUTH DAILY., Disp: 30 capsule, Rfl: 1  Medication Side Effects: none  Family Medical/ Social History: Changes? Yes patient states she is exhausted with husband's mental illness as well as COVID time as she continues working from home.  MENTAL HEALTH EXAM:  Height 5\' 5"  (1.651 m), weight 232 lb (105.2 kg).Body mass index is 38.61 kg/m.  but not present in office today or on video  General Appearance: N/A  Eye Contact:  N/A  Speech:  Clear and Coherent, Normal Rate and Talkative  Volume:  Normal  Mood:  Anxious, Depressed, Dysphoric, Hopeless, Irritable and Worthless  Affect:  Congruent, Depressed, Inappropriate, Restricted and Anxious  Thought Process:  Coherent,  Irrelevant and Descriptions of Associations: Tangential  Orientation:  Full (Time, Place, and Person)  Thought Content: Logical, Ilusions, Rumination and Tangential   Suicidal Thoughts:  No  Homicidal Thoughts:  No  Memory:  Immediate;   Good Remote;   Good  Judgement:  Fair  Insight:  Fair  Psychomotor Activity:  N/A  Concentration:  Concentration: Fair and Attention Span: Fair  Recall:  [employer] of Knowledge: Good  Language: Good  Assets:  Resilience Talents/Skills Vocational/Educational  ADL's:  Intact  Cognition: WNL  Prognosis:  Fair    DIAGNOSES:    ICD-10-CM   1. Bipolar I disorder, moderate, current or most recent episode depressed, with anxious distress (HCC)  F31.32 methylphenidate (RITALIN) 10 MG tablet    methylphenidate (RITALIN) 10 MG tablet  2. 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  She reports apathy and anergia while still having stressors for self, kids and husband's mental illness when her neck may need surgery as neuro is following to see next. Therapist says she has to learn to live with the anxiety and depression.  She is satisfied with change from Abilify to [place] taking 3 mg every morning having another refill for a month supply for bipolar disorder.  She has Lamictal 200 mg taken 2 tablets total 400 mg every bedtime for bipolar she has declined to reduce [facility] [address] with no refill.  She still has current supply of Wellbutrin 150 mg XL taking 3 every morning for anxiety and depression, Lexapro 20 mg every morning for anxiety  and depression, and diazepam 5 mg twice daily if needed for anxiety or agitation.  She is newly escribed Ritalin 10 mg IR tablet twice daily with  breakfast and lunch #60 each for February 17 March 19 for fatigue and depression sent [facility] as she insists that anergia and apathy need some medication before she can apply herself more effectively in therapy again and in daily life events on her medication to start to improve.  She returns for follow-up in 2 months or sooner if needed.     Virtual Visit via Video Note  I connected with Hampton Spurgeon-Yocum on 11/12/19 at  3:00 PM EST by a video enabled telemedicine application and verified that I am speaking with the correct person using two identifiers.  Location: Patient: Individually audio only with privacy and family residents refusing camera for anxiety Provider: [facility] office   I discussed the limitations of evaluation and management by telemedicine and the availability of in person appointments. The patient expressed understanding and agreed to proceed.  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  Patient's fixation on follow-up every 8 months to 9 months from previous psychiatrist Dr. Tomasa Rand continues despite attempt to shorten to 6 months last appointment.  She presents  exhausted despair and anxious posture now relative to cervical radiculopathy possibly needing surgery as well as family stress including husband's mental illness that feels protracted to her as she waits months to discuss.     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  She reports apathy and anergia while still having stressors for self, kids and husband's mental illness when her neck may need surgery as neuro is following to see next. Therapist says she has to learn to live with the anxiety and depression.  She is satisfied with change from Abilify to Vraylar taking 3 mg every morning having another refill for a month supply for bipolar disorder.  She has Lamictal 200 mg taken 2 tablets total 400 mg every bedtime for bipolar she has declined to reduce sent to [facility] [address] with no refill.  She still has current supply of Wellbutrin 150 mg XL taking 3 every morning for anxiety and depression, Lexapro 20 mg every morning for anxiety and depression, and diazepam 5 mg twice daily if needed for anxiety or agitation.  She is newly escribed Ritalin 10 mg IR tablet twice daily with breakfast and lunch #60 each for February 17 March 19 for fatigue and depression sent to [facility] as she insists that anergia and apathy need some medication before she can apply herself more effectively in therapy again and in daily life events on her medication to start to improve.   Follow Up Instructions:  She returns for follow-up in 2 months or sooner if needed.     I discussed the assessment and treatment plan with the patient. The patient was provided an opportunity to ask questions and all were answered. The patient agreed with the plan and demonstrated an understanding of the instructions.   The patient was advised to call back or seek an in-person evaluation if the symptoms worsen or if  the condition fails to improve as anticipated.  I provided 2 things0, minutes of  non-face-to-face time during this encounter. [employer] meeting #[id number] Meeting password: [email or web address]  Chauncey Mann, MD   Chauncey Mann, MD

## 2019-11-23 ENCOUNTER — Other Ambulatory Visit: Payer: Self-pay | Admitting: Psychiatry

## 2019-11-23 DIAGNOSIS — F88 Other disorders of psychological development: Secondary | ICD-10-CM

## 2019-11-23 DIAGNOSIS — F3132 Bipolar disorder, current episode depressed, moderate: Secondary | ICD-10-CM

## 2019-11-23 DIAGNOSIS — F411 Generalized anxiety disorder: Secondary | ICD-10-CM

## 2019-11-23 DIAGNOSIS — G4733 Obstructive sleep apnea (adult) (pediatric): Secondary | ICD-10-CM

## 2019-12-01 ENCOUNTER — Other Ambulatory Visit: Payer: Self-pay | Admitting: Psychiatry

## 2019-12-01 DIAGNOSIS — F88 Other disorders of psychological development: Secondary | ICD-10-CM

## 2019-12-01 DIAGNOSIS — F3132 Bipolar disorder, current episode depressed, moderate: Secondary | ICD-10-CM

## 2019-12-01 DIAGNOSIS — F411 Generalized anxiety disorder: Secondary | ICD-10-CM

## 2019-12-12 ENCOUNTER — Telehealth: Payer: Self-pay | Admitting: Psychiatry

## 2019-12-12 ENCOUNTER — Other Ambulatory Visit: Payer: Self-pay

## 2019-12-12 DIAGNOSIS — F411 Generalized anxiety disorder: Secondary | ICD-10-CM

## 2019-12-12 DIAGNOSIS — F3132 Bipolar disorder, current episode depressed, moderate: Secondary | ICD-10-CM

## 2019-12-12 MED ORDER — ESCITALOPRAM OXALATE 20 MG PO TABS: 20.0000 mg | ORAL_TABLET | Freq: Every day | ORAL | 0 refills | Status: DC

## 2019-12-12 NOTE — Telephone Encounter (Signed)
Pt requesting refills on her Lamotrogine and Cariprazine. Appt scheduled for 4/19.

## 2019-12-12 NOTE — Telephone Encounter (Signed)
Ritalin and Lexapro are what she needs. [facility]. It is Not the one in [employer].

## 2019-12-12 NOTE — Telephone Encounter (Signed)
Patient already has RX for Ritalin dated 12/09/2019, will submit Lexapro 20 mg.

## 2019-12-27 ENCOUNTER — Other Ambulatory Visit: Payer: Self-pay | Admitting: Psychiatry

## 2019-12-27 ENCOUNTER — Encounter (HOSPITAL_BASED_OUTPATIENT_CLINIC_OR_DEPARTMENT_OTHER): Payer: Self-pay | Admitting: *Deleted

## 2019-12-27 ENCOUNTER — Other Ambulatory Visit: Payer: Self-pay

## 2019-12-27 ENCOUNTER — Emergency Department (HOSPITAL_BASED_OUTPATIENT_CLINIC_OR_DEPARTMENT_OTHER)
Admission: EM | Admit: 2019-12-27 | Discharge: 2019-12-27 | Disposition: A | Discharge status: Home / Self Care | Payer: 59 | Attending: Emergency Medicine | Admitting: Emergency Medicine

## 2019-12-27 DIAGNOSIS — M545 Low back pain: Secondary | ICD-10-CM | POA: Diagnosis present

## 2019-12-27 DIAGNOSIS — Z20822 Contact with and (suspected) exposure to covid-19: Secondary | ICD-10-CM | POA: Diagnosis not present

## 2019-12-27 DIAGNOSIS — M5416 Radiculopathy, lumbar region: Secondary | ICD-10-CM | POA: Insufficient documentation

## 2019-12-27 DIAGNOSIS — F3132 Bipolar disorder, current episode depressed, moderate: Secondary | ICD-10-CM

## 2019-12-27 DIAGNOSIS — F411 Generalized anxiety disorder: Secondary | ICD-10-CM

## 2019-12-27 DIAGNOSIS — F88 Other disorders of psychological development: Secondary | ICD-10-CM

## 2019-12-27 LAB — COMPREHENSIVE METABOLIC PANEL
ALT: 69 U/L — ABNORMAL HIGH (ref 0–44)
AST: 22 U/L (ref 15–41)
Albumin: 3.5 g/dL (ref 3.5–5.0)
Alkaline Phosphatase: 199 U/L — ABNORMAL HIGH (ref 38–126)
Anion gap: 13 (ref 5–15)
BUN: 15 mg/dL (ref 6–20)
CO2: 24 mmol/L (ref 22–32)
Calcium: 9.2 mg/dL (ref 8.9–10.3)
Chloride: 105 mmol/L (ref 98–111)
Creatinine, Ser: 0.94 mg/dL (ref 0.44–1.00)
GFR calc Af Amer: >60 mL/min (ref >60)
GFR calc non Af Amer: >60 mL/min (ref >60)
Glucose, Bld: 90 mg/dL (ref 70–99)
Potassium: 3.5 mmol/L (ref 3.5–5.1)
Sodium: 142 mmol/L (ref 135–145)
Total Bilirubin: 0.4 mg/dL (ref 0.3–1.2)
Total Protein: 6.7 g/dL (ref 6.5–8.1)

## 2019-12-27 LAB — CBC WITH DIFFERENTIAL/PLATELET
Abs Immature Granulocytes: 0.05 10*3/uL (ref 0.00–0.07)
Basophils Absolute: 0 10*3/uL (ref 0.0–0.1)
Basophils Relative: 0 %
Eosinophils Absolute: 0.3 10*3/uL (ref 0.0–0.5)
Eosinophils Relative: 2 %
HCT: 41 % (ref 36.0–46.0)
Hemoglobin: 13.9 g/dL (ref 12.0–15.0)
Immature Granulocytes: 1 %
Lymphocytes Relative: 23 %
Lymphs Abs: 2.5 10*3/uL (ref 0.7–4.0)
MCH: 31.1 pg (ref 26.0–34.0)
MCHC: 33.9 g/dL (ref 30.0–36.0)
MCV: 91.7 fL (ref 80.0–100.0)
Monocytes Absolute: 0.8 10*3/uL (ref 0.1–1.0)
Monocytes Relative: 7 %
Neutro Abs: 7.3 10*3/uL (ref 1.7–7.7)
Neutrophils Relative %: 67 %
Platelets: 234 10*3/uL (ref 150–400)
RBC: 4.47 MIL/uL (ref 3.87–5.11)
RDW: 12.9 % (ref 11.5–15.5)
WBC: 10.9 10*3/uL — ABNORMAL HIGH (ref 4.0–10.5)
nRBC: 0 % (ref 0.0–0.2)

## 2019-12-27 LAB — RESPIRATORY PANEL BY RT PCR (FLU A&B, COVID)
Influenza A by PCR: NEGATIVE
Influenza B by PCR: NEGATIVE
SARS Coronavirus 2 by RT PCR: NEGATIVE

## 2019-12-27 MED ORDER — HYDROMORPHONE HCL 2 MG PO TABS: 1.0000 mg | ORAL_TABLET | Freq: Four times a day (QID) | ORAL | 0 refills | Status: AC | PRN

## 2019-12-27 MED ORDER — KETOROLAC TROMETHAMINE 30 MG/ML IJ SOLN
30.0000 mg | Freq: Once | INTRAMUSCULAR | Status: AC
  Administered 2019-12-27: 30 mg via INTRAVENOUS
  Filled 2019-12-27: qty 1

## 2019-12-27 MED ORDER — HYDROMORPHONE HCL 1 MG/ML IJ SOLN
1.0000 mg | Freq: Once | INTRAMUSCULAR | Status: AC
  Administered 2019-12-27: 1 mg via INTRAVENOUS
  Filled 2019-12-27: qty 1

## 2019-12-27 NOTE — ED Triage Notes (Addendum)
2 weeks ago she started having back pain. She had an MRI and saw her neurologist. She was diagnosed with a bulging disc. She was told to take Ibuprofen. Then given a steroid dose pack and Vicodin with no improvement. She has been seen at a different date and been given a Rx for Morphine with no relief. She has been seen numerous times for the past 2 weeks. She is now taking Ketamine, tylenol, Robaxin, Voltaren. She is constipated. She is incontinent of stool since using an enema. Pain now goes down her right leg. She went to [facility] today and waited 5 hours without being seen due to ED wait time. She left and came here.

## 2019-12-27 NOTE — ED Notes (Signed)
Pt teaching provided on medications that may cause drowsiness. Pt instructed not to drive or operate heavy machinery while taking the prescribed medication. Pt verbalized understanding.   

## 2019-12-27 NOTE — ED Provider Notes (Signed)
Emergency Department Provider Note   I have reviewed the triage vital signs and the nursing notes.   HISTORY  Chief Complaint Back Pain   HPI Madison Henry is a 49 y.o. female with factor V Leiden mutation and prior CVA  along with severe spinal stenosis followed primarily at [facility] by both neurology and neurosurgery presents to the emergency department today with intractable pain.  She has had several visits to the [facility] emergency department in the last 2 weeks when her pain has acutely worsened.  She had an MRI of her lumbar spine yesterday and has been seen, evaluated, sent home on various pain medications with no lasting relief.  She has had an office visit with the neurosurgeon, Dr. Gerilyn Nestle, and is followed primarily by Neurology, [facility], NP in [facility].  She has had worsening pain today with symptoms radiating down the right leg.  She denies any numbness or weakness but is unable to walk at home due to pain.  Her husband has been caring her around the house.  They called to discuss this again with the neurology team by phone and were referred to the emergency department at [facility] for admission for intractable pain.  Patient has an epidural scheduled for Thursday and has attempted outpatient physical therapy but was unable to perform any exercises due to severe pain.  She went to the [facility] emergency department today by ambulance but was placed in the waiting room due to Madison Henry wait times.  She reports waiting 5 hours and then called here to see if we could see her records.  She then left that emergency department and came here for pain management and evaluation. Denies neck pain. No arm symptoms. No groin numbness. No fevers or chills. No UTI symptoms. She has been able to urinate but has had some fecal incontinence which has been the case since her ED visit yesterday by patient report. She was treated for constipation at that time and diarrhea has persisted  since.    Past Medical History:  Diagnosis Date  . Factor 5 Leiden mutation, heterozygous ([place])   . Stroke [facility])     Patient Active Problem List   Diagnosis Date Noted  . Bipolar I disorder, moderate, current or most recent episode depressed, with anxious distress ([place]) 02/24/2019  . Generalized anxiety disorder 02/24/2019  . 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 OSA (obstructive sleep apnea) 01/05/2016    Past Surgical History:  Procedure Laterality Date  . ABDOMINAL SURGERY     3 c sections  . CHOLECYSTECTOMY      Allergies Patient has no known allergies.  No family history on file.  Social History Social History   Tobacco Use  . Smoking status: Never Smoker  . Smokeless tobacco: Never Used  Substance Use Topics  . Alcohol use: Not Currently  . Drug use: Never    Review of Systems  Constitutional: No fever/chills Eyes: No visual changes. ENT: No sore throat. Cardiovascular: Denies chest pain. Respiratory: Denies shortness of breath. Gastrointestinal: No abdominal pain.  No nausea, no vomiting.  No diarrhea.  No constipation. Genitourinary: Negative for dysuria. Musculoskeletal: Positive for back pain. Skin: Negative for rash. Neurological: Negative for headaches, focal weakness or numbness.  10-point ROS otherwise negative.  ____________________________________________   PHYSICAL EXAM:  VITAL SIGNS: ED Triage Vitals  Enc Vitals Group     BP 12/27/19 1800 119/85     Pulse Rate 12/27/19 1800 (!) 103  Resp 12/27/19 1800 20     Temp 12/27/19 1800 98.1 F (36.7 C)     Temp Source 12/27/19 1800 Oral     SpO2 12/27/19 1800 98 %     Weight 12/27/19 1759 235 lb (106.6 kg)     Height 12/27/19 1759 5\' 5"  (1.651 m)   Constitutional: Alert and oriented. Well appearing and in no acute distress. Eyes: Conjunctivae are normal.  Head: Atraumatic. Nose: No congestion/rhinnorhea. Mouth/Throat: Mucous membranes are moist.  Neck: No stridor.   Cardiovascular: Normal rate, regular rhythm. Good peripheral circulation. Grossly normal heart sounds.   Respiratory: Normal respiratory effort.  No retractions. Lungs CTAB. Gastrointestinal: Soft and nontender. No distention.  Musculoskeletal: No lower extremity tenderness nor edema. No gross deformities of extremities. Neurologic:  Normal speech and language. No gross focal neurologic deficits are appreciated. Normal sensation in the bilateral LEs with 4+/5 strength bilaterally 2/2 pain.  Skin:  Skin is warm, dry and intact. No rash noted.   ____________________________________________   LABS (all labs ordered are listed, but only abnormal results are displayed)  Labs Reviewed  COMPREHENSIVE METABOLIC PANEL - Abnormal; Notable for the following components:      Result Value   ALT 69 (*)    Alkaline Phosphatase 199 (*)    All other components within normal limits  CBC WITH DIFFERENTIAL/PLATELET - Abnormal; Notable for the following components:   WBC 10.9 (*)    All other components within normal limits  RESPIRATORY PANEL BY RT PCR (FLU A&B, COVID)   ____________________________________________   PROCEDURES  Procedure(s) performed:   Procedures  None ____________________________________________   INITIAL IMPRESSION / ASSESSMENT AND PLAN / ED COURSE  Pertinent labs & imaging results that were available during my care of the patient were reviewed by me and considered in my medical decision making (see chart for details).   Patient presents to the emergency department with intractable back pain.  I have reviewed the care everywhere telephone encounters and recent neurology notes.  I reviewed the neurosurgery evaluation and note from 12/22/2019.  They have a plan to see her back in 6 weeks after trial of epidural injection and along with physical therapy as she has only tried oral medications for her back pain.  According to a telephone encounter with nurse 02/21/2020 patient was  referred back to the emergency department for admit for pain mgmt.   Patient's labs are unremarkable. Exam seems consistent today with exams recently in the ED and MRI from yesterday reviewed. Stool incontinence started in the setting of constipation treatment in the ED and is not thought to be due to cauda equina. No objective findings today to suspect this diagnosis. Spoke with her Neurosurgeon on call, Dr. Tish Men, who reviewed the images and recalls the case. No emergent surgery planned. Patient can be admitted for pain control and PT. Per [facility] transfer site there is a wait list for admits likely 24-48 hours Meriam Chojnowski. Discussed this with patient and husband. She has an outpatient epidural scheduled and would not want to wait in the ED for admit when that is an option for her. Provided a very small Rx for Dilaudid and would like to go home for further pain mgmt.   Discussed ED return precautions. Reviewed the [place] drug database prior to Rx for narcotic pain medication.  ____________________________________________  FINAL CLINICAL IMPRESSION(S) / ED DIAGNOSES  Final diagnoses:  Lumbar radiculopathy     MEDICATIONS GIVEN DURING THIS VISIT:  Medications  HYDROmorphone (DILAUDID) injection 1  mg (1 mg Intravenous Given 12/27/19 1958)  ketorolac (TORADOL) 30 MG/ML injection 30 mg (30 mg Intravenous Given 12/27/19 1958)  HYDROmorphone (DILAUDID) injection 1 mg (1 mg Intravenous Given 12/27/19 2137)     NEW OUTPATIENT MEDICATIONS STARTED DURING THIS VISIT:  Discharge Medication List as of 12/27/2019 10:17 PM    START taking these medications   Details  HYDROmorphone (DILAUDID) 2 MG tablet Take 0.5 tablets (1 mg total) by mouth every 6 (six) hours as needed for severe pain., Starting Tue 12/27/2019, Normal        Note:  This document was prepared using Dragon voice recognition software and may include unintentional dictation errors.  Alona Bene, MD, [facility] Emergency Medicine    Shresta Risden, Arlyss Repress,  MD 12/28/19 2005

## 2019-12-27 NOTE — ED Notes (Signed)
[place] @ [phone] [place]) - she will have someone call back

## 2019-12-27 NOTE — Discharge Instructions (Signed)

## 2020-01-09 ENCOUNTER — Encounter: Payer: Self-pay | Admitting: Psychiatry

## 2020-01-09 ENCOUNTER — Ambulatory Visit (INDEPENDENT_AMBULATORY_CARE_PROVIDER_SITE_OTHER): Payer: 59 | Admitting: Psychiatry

## 2020-01-09 DIAGNOSIS — F3132 Bipolar disorder, current episode depressed, moderate: Secondary | ICD-10-CM

## 2020-01-09 DIAGNOSIS — G4733 Obstructive sleep apnea (adult) (pediatric): Secondary | ICD-10-CM

## 2020-01-09 DIAGNOSIS — F411 Generalized anxiety disorder: Secondary | ICD-10-CM | POA: Diagnosis not present

## 2020-01-09 DIAGNOSIS — F88 Other disorders of psychological development: Secondary | ICD-10-CM | POA: Diagnosis not present

## 2020-01-09 MED ORDER — METHYLPHENIDATE HCL 10 MG PO TABS: 10.0000 mg | ORAL_TABLET | Freq: Two times a day (BID) | ORAL | 0 refills | Status: DC

## 2020-01-09 MED ORDER — ESCITALOPRAM OXALATE 20 MG PO TABS: 20.0000 mg | ORAL_TABLET | Freq: Every day | ORAL | 0 refills | Status: DC

## 2020-01-09 MED ORDER — BUPROPION HCL ER (XL) 150 MG PO TB24: 450.0000 mg | ORAL_TABLET | Freq: Every day | ORAL | 0 refills | Status: DC

## 2020-01-09 MED ORDER — LAMOTRIGINE 200 MG PO TABS: 400.0000 mg | ORAL_TABLET | Freq: Every day | ORAL | 0 refills | Status: DC

## 2020-01-09 MED ORDER — VRAYLAR 4.5 MG PO CAPS: 4.5000 mg | ORAL_CAPSULE | Freq: Every day | ORAL | 0 refills | Status: DC

## 2020-01-09 NOTE — Progress Notes (Signed)
[facility]  Patient ID: Madison Henry,  MRN: [id number]  PCP: Elijio Miles., MD  Date of Evaluation: 01/09/2020 Time spent:25 minutes from 1415 to 1440  Chief Complaint:  Chief Complaint    Depression; Manic Behavior; Anxiety; Trauma      HISTORY/CURRENT STATUS: Madison Henry is provided telemedicine A/V appointment session video to video individually with consent with epic collateral for psychiatric interview and exam in 6-week evaluation and management of bipolar depression and secondary neurodevelopmental disorder perinatal stroke having sustained depressive episode particularly during major medical decompensations.  Patient reviews at length her current spinal stenosis with bulging disc and arthritic changes for which cervical spine will need surgery before the low back can be done.  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  Patient is catching up on such work but still realizes she has upcoming surgery continued medical downtime.  At last appointment she started Ritalin 10 mg twice daily for the sleepiness and psychomotor retardation of her depression when she takes Wellbutrin 450 mg, Lamictal 400 mg, Lexapro 20 mg, and newly changed 10 months ago Abilify 15 mg to Vraylar 3 mg after 9 months before that reducing Abilify from 30 to 15 mg..  Patient did note improvement in changing from Abilify to Vraylar but not sufficient for with relief of depression in addition to disengagement from any Abilify side effects.  She is not aware of side effects from Vraylar but it also has limited efficacy.  She has stopped most of her muscle relaxers such as Valium and her narcotics for pain relief so that she  can think and emotionally function better.  She is willing to follow-up more often after always being fixated in her previous care from Dr. Tomasa Rand to be seen only every 8 months and no more often.  She is not suicidal, psychotic, delirious or manic though she does have the stress of daughter's depression and husband's anger problems currently.    Depression             The patient presents withdepression as a recurrentproblem concluded bipolar by patient and previous psychiatrist from a time of manic hypersexual and disinhibited behavior.The current episode started more than 19 months ago. The onset quality is sudden. The problem occurs intermittently.The problem has been waxing and waningsince onset.Associated symptoms include decreased concentration,fatigue, decreased energy,hopelessness, easy mood swings,decreased interest,appetite change,myalgiasand sad. Associated symptoms include no insomnia,no irritability, no perceptual disorder, no akathisia or EPS, no recent mania, and no suicidal ideation.The symptoms are aggravated by social issues, family issues, medication and work stress.Past treatments include SSRIs - Selective serotonin reuptake inhibitors, other medications and psychotherapy.Compliance with treatment is good.Past compliance problems include medical issues, medication issues and difficulty with treatment plan.Previous treatment provided moderaterelief.Risk factors include family history, major life event, stress and a recent illness. Past medical history includes physical disability,anxiety,bipolar disorder,depressionand mental health disorder. Pertinent negatives include no chronic illness,no recent illness,no life-threatening condition,no recent psychiatric admission,no eating disorder,no obsessive-compulsive disorder,no post-traumatic stress disorder,no schizophreniaand no head trauma.  Individual Medical History/ Review of Systems:  Changes? :  Labs as recorded last appointment were intact except LDL cholesterol elevated at 109 g/dL total cholesterol 169.  Allergies: Patient has no known allergies.  Current  Medications:  Current Outpatient Medications:  .  buPROPion (WELLBUTRIN XL) 150 MG 24 hr tablet, Take 3 tablets (450 mg total) by mouth daily after breakfast., Disp: 270 tablet, Rfl: 0 .  Cariprazine HCl (VRAYLAR) 4.5 MG CAPS, Take 1 capsule (4.5 mg total) by mouth daily after breakfast., Disp: 90 capsule, Rfl: 0 .  diazepam (VALIUM) 5 MG tablet, Take 5 mg by mouth 2 (two) times daily as needed for anxiety or agitation., Disp: , Rfl:  .  escitalopram (LEXAPRO) 20 MG tablet, Take 1 tablet (20 mg total) by mouth daily after breakfast., Disp: 90 tablet, Rfl: 0 .  HYDROcodone-acetaminophen (NORCO) 5-325 MG tablet, Take 1-2 tablets by mouth every 6 (six) hours as needed., Disp: 15 tablet, Rfl: 0 .  HYDROmorphone (DILAUDID) 2 MG tablet, Take 0.5 tablets (1 mg total) by mouth every 6 (six) hours as needed for severe pain., Disp: 10 tablet, Rfl: 0 .  lamoTRIgine (LAMICTAL) 200 MG tablet, Take 2 tablets (400 mg total) by mouth at bedtime., Disp: 180 tablet, Rfl: 0 .  methylphenidate (RITALIN) 10 MG tablet, Take 1 tablet (10 mg total) by mouth 2 (two) times daily with breakfast and lunch., Disp: 60 tablet, Rfl: 0 .  [START ON 02/08/2020] methylphenidate (RITALIN) 10 MG tablet, Take 1 tablet (10 mg total) by mouth 2 (two) times daily with breakfast and lunch., Disp: 60 tablet, Rfl: 0 .  [START ON 03/09/2020] methylphenidate (RITALIN) 10 MG tablet, Take 1 tablet (10 mg total) by mouth 2 (two) times daily with breakfast and lunch., Disp: 60 tablet, Rfl: 0 .  predniSONE (DELTASONE) 10 MG tablet, Take 2 tablets (20 mg total) by mouth 2 (two) times daily., Disp: 20 tablet, Rfl: 0   Medication Side Effects: none   Family Medical/ Social History: Changes? Yes she discusses daughter's problems even more than husband today.  MENTAL HEALTH  EXAM:  There were no vitals taken for this visit.There is no height or weight on file to calculate BMI.  as not present here today.  General Appearance: Casual, Fairly Groomed, Meticulous and Obese  Eye Contact:  Good  Speech:  Clear and Coherent, Normal Rate and Talkative  Volume:  Normal  Mood:  Anxious, Depressed, Dysphoric, Irritable and Worthless  Affect:  Congruent, Depressed, Inappropriate, Full Range and Anxious  Thought Process:  Coherent, Goal Directed, Irrelevant and Descriptions of Associations: Tangential  Orientation:  Full (Time, Place, and Person)  Thought Content: Logical, Paranoid Ideation, Rumination and Tangential   Suicidal Thoughts:  No  Homicidal Thoughts:  No  Memory:  Immediate;   Good Remote;   Good  Judgement:  Fair  Insight:  Fair  Psychomotor Activity:  Normal, Decreased, Mannerisms and Psychomotor Retardation  Concentration:  Concentration: Fair and Attention Span: Good  Recall:  Good  Fund of Knowledge: Good  Language: Good  Assets:  Communication Skills Desire for Improvement Resilience Talents/Skills  ADL's:  Intact  Cognition: WNL  Prognosis:  Fair    DIAGNOSES:    ICD-10-CM   1. Bipolar I disorder, moderate, current or most recent episode depressed, with anxious distress (HCC)  F31.32 escitalopram (LEXAPRO) 20 MG tablet    methylphenidate (RITALIN) 10 MG tablet    methylphenidate (RITALIN) 10 MG tablet    methylphenidate (RITALIN) 10 MG tablet    buPROPion (WELLBUTRIN XL) 150 MG 24 hr tablet    lamoTRIgine (LAMICTAL) 200 MG tablet    Cariprazine HCl (VRAYLAR) 4.5 MG CAPS  2. Generalized anxiety disorder  F41.1 escitalopram (LEXAPRO) 20 MG  tablet    buPROPion (WELLBUTRIN XL) 150 MG 24 hr tablet    lamoTRIgine (LAMICTAL) 200 MG tablet  3. Secondary neurodevelopmental disorder  F88 methylphenidate (RITALIN) 10 MG tablet    methylphenidate (RITALIN) 10 MG tablet    methylphenidate (RITALIN) 10 MG tablet    buPROPion (WELLBUTRIN XL) 150 MG  24 hr tablet    lamoTRIgine (LAMICTAL) 200 MG tablet    Cariprazine HCl (VRAYLAR) 4.5 MG CAPS  4. OSA (obstructive sleep apnea)  G47.33 buPROPion (WELLBUTRIN XL) 150 MG 24 hr tablet    Receiving Psychotherapy: Yes  [facility]   RECOMMENDATIONS: Low-dose Ritalin is helping in addition to [place] and Wellbutrin having previously had the stimulant effect of Abilify though even at 30 mg having more slowing than efficacy.  Increasing the Arman Filter is planned for today for the bipolar depression to consider reduction of Lexapro to follow if symptoms persist.  She is E scribed Ritalin 10 mg twice daily sent as #60 each for depression associated with secondary neurodevelopmental disorder for April 19, May 19, and June 18 to [facility] [place] [address]  Vraylar is increased to 4.5 mg every morning sent as #90 with no refill E scribed to [facility].  She is E scribed Lamictal 200 mg taking 2 every bedtime total of 400 mg for bipolar depression as #180 with no refill to [facility] [place].  She is E scribed Wellbutrin 150 mg XL taking 3 every morning a total of 450 mg #270 with no refill to [facility] for bipolar depression.  She is E scribed Lexapro 20 mg every morning #90 with no refills sent to [facility] for depression.  She returns for follow-up in 3 months or sooner if needed as education on prevention and monitoring for safety hygiene is provided and assured.  Virtual Visit via Video Note  I connected with Madison Henry on 01/09/20 at  2:40 PM EDT by a video enabled telemedicine application and verified that I am speaking with the correct person using two identifiers.  Location: Patient: Audio and video session at family residence working from home but also convalescing from orthopedic disorders with privacy has been seen recently with their daughter for her appointment in another section of the house Provider:  [facility] office   I discussed the limitations of evaluation and management by telemedicine and the availability of in person appointments. The patient expressed understanding and agreed to proceed.  History of Present Illness:  6-week evaluation and management  bipaddressolar depression and secondary neurodevelopmental disorder perinatal stroke having sustained depressive episode particularly during major medical decompensations.  Patient reviews at length her current spinal stenosis with bulging disc and arthritic changes for which cervical spine will need surgery before the low back can be done.  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 that is still resented by her senior supervisor though company has downsized to the point that she has no lateral assistance   Observations/Objective: Mood:  Anxious, Depressed, Dysphoric, Irritable and Worthless  Affect:  Congruent, Depressed, Inappropriate, Full Range and Anxious  Thought Process:  Coherent, Goal Directed, Irrelevant and Descriptions of Associations: Tangential  Orientation:  Full (Time, Place, and Person)  Thought Content: Logical, Paranoid Ideation, Rumination and Tangential    Assessment and Plan: Low-dose Ritalin is helping in addition to [employer] and  Wellbutrin having previously had the stimulant effect of Abilify though even at 30 mg having more slowing than efficacy.  Increasing the [place] is planned for today for the bipolar depression to consider reduction of Lexapro to follow if symptoms persist.  She is E scribed Ritalin 10 mg twice daily sent as #60 each for depression associated with secondary neurodevelopmental disorder for April 19, May 19, and June 18 to [facility] [place] [facility] [address]  Vraylar is increased to 4.5 mg every morning sent as #90 with no refill E scribed to [facility].  She is E scribed Lamictal 200 mg taking 2 every bedtime total of 400 mg for bipolar depression as #180 with no refill to [facility] [place].  She is E scribed Wellbutrin 150 mg XL taking 3 every morning a total of 450 mg #270 with no refill to [facility] for bipolar depression.  She is E scribed Lexapro 20 mg every morning #90 with no refills sent to [facility] for depression  Follow Up Instructions: She returns for follow-up in 3 months or sooner if needed as education on prevention and monitoring for safety hygiene is provided and assured.     I discussed the assessment and treatment plan with the patient. The patient was provided an opportunity to ask questions and all were answered. The patient agreed with the plan and demonstrated an understanding of the instructions.   The patient was advised to call back or seek an in-person evaluation if the symptoms worsen or if the condition fails to improve as anticipated.  I provided 25 minutes of non-face-to-face time during this encounter. [employer] meeting #[id number] Meeting password: [email or web address]  Chauncey Mann, MD  Chauncey Mann, MD

## 2020-01-19 ENCOUNTER — Other Ambulatory Visit: Payer: Self-pay | Admitting: Psychiatry

## 2020-01-19 DIAGNOSIS — F3132 Bipolar disorder, current episode depressed, moderate: Secondary | ICD-10-CM

## 2020-03-08 ENCOUNTER — Telehealth: Payer: Self-pay

## 2020-03-08 ENCOUNTER — Telehealth: Payer: Self-pay | Admitting: Psychiatry

## 2020-03-08 NOTE — Telephone Encounter (Signed)
Prior authorization submitted and approved for VRAYLAR 4.5 MG CAPSULES effective 03/08/2020-03/08/2021 with [facility].

## 2020-03-08 NOTE — Telephone Encounter (Signed)
[employer] prior authorization of Vraylar 4.5 mg through 03/08/2021 received by office nursing.

## 2020-04-09 ENCOUNTER — Telehealth: Payer: Self-pay | Admitting: Psychiatry

## 2020-04-09 ENCOUNTER — Encounter: Payer: Self-pay | Admitting: Psychiatry

## 2020-04-09 ENCOUNTER — Telehealth (INDEPENDENT_AMBULATORY_CARE_PROVIDER_SITE_OTHER): Payer: 59 | Admitting: Psychiatry

## 2020-04-09 DIAGNOSIS — G4733 Obstructive sleep apnea (adult) (pediatric): Secondary | ICD-10-CM | POA: Diagnosis not present

## 2020-04-09 DIAGNOSIS — F88 Other disorders of psychological development: Secondary | ICD-10-CM | POA: Diagnosis not present

## 2020-04-09 DIAGNOSIS — F411 Generalized anxiety disorder: Secondary | ICD-10-CM

## 2020-04-09 DIAGNOSIS — F3132 Bipolar disorder, current episode depressed, moderate: Secondary | ICD-10-CM

## 2020-04-09 MED ORDER — LAMOTRIGINE 200 MG PO TABS: 400.0000 mg | ORAL_TABLET | Freq: Every day | ORAL | 1 refills | Status: DC

## 2020-04-09 MED ORDER — ESCITALOPRAM OXALATE 20 MG PO TABS: 20.0000 mg | ORAL_TABLET | Freq: Every day | ORAL | 1 refills | Status: DC

## 2020-04-09 MED ORDER — VRAYLAR 4.5 MG PO CAPS: 4.5000 mg | ORAL_CAPSULE | Freq: Every day | ORAL | 1 refills | Status: DC

## 2020-04-09 MED ORDER — METHYLPHENIDATE HCL 10 MG PO TABS: 10.0000 mg | ORAL_TABLET | Freq: Two times a day (BID) | ORAL | 0 refills | Status: DC

## 2020-04-09 MED ORDER — BUPROPION HCL ER (XL) 150 MG PO TB24: 450.0000 mg | ORAL_TABLET | Freq: Every day | ORAL | 1 refills | Status: DC

## 2020-04-09 NOTE — Telephone Encounter (Signed)
Ms. conny, situ are scheduled for a virtual visit with your provider today.    Just as we do with appointments in the office, we must obtain your consent to participate.  Your consent will be active for this visit and any virtual visit you may have with one of our providers in the next 365 days.    If you have a MyChart account, I can also send a copy of this consent to you electronically.  All virtual visits are billed to your insurance company just like a traditional visit in the office.  As this is a virtual visit, video technology does not allow for your provider to perform a traditional examination.  This may limit your provider's ability to fully assess your condition.  If your provider identifies any concerns that need to be evaluated in person or the need to arrange testing such as labs, EKG, etc, we will make arrangements to do so.    Although advances in technology are sophisticated, we cannot ensure that it will always work on either your end or our end.  If the connection with a video visit is poor, we may have to switch to a telephone visit.  With either a video or telephone visit, we are not always able to ensure that we have a secure connection.   I need to obtain your verbal consent now.   Are you willing to proceed with your visit today?   Madison Henry has provided verbal consent on 04/09/2020 for a virtual visit (video or telephone).   Chauncey Mann, MD 04/09/2020  4:15 PM

## 2020-04-09 NOTE — Progress Notes (Signed)
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  Multiple acute stressors with health, family, and work have occurred as she awaits her upcoming surgery on the upper back after emergency surgery July 4 on the low back.  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  Husband has a new rheumatoid arthritis diagnosis apparently still having anger.  Older daughter has been home during the patient's surgery supporting the younger daughter who is overall doing better but very slowly.  She estimates 60 to 90 days left for the company of her job.  She is taking her Ritalin only during workdays otherwise continuing Lamictal, Lexapro, Wellbutrin and Vraylar.  She is surprised she is coping with all these problems reasonably well thus far and has not experienced decompensation in depression or anxiety on current medications.  She has no mania, suicidality, psychosis or delirium.  Depression The patient presents withdepressionas a recurrentproblemconcluded bipolar by patient and previous psychiatristfromatime of manic hypersexual and  disinhibited behavior.The current episode started more than 22 months ago. The onset quality is sudden. The problem occurs intermittently.The problem has been waxing and waningsince onset.Associated symptoms include decreased concentration,fatigue,decreased energy,, easy mood swings, decreased interest,appetite change,myalgias,and reactive sadness. Associated symptoms include no insomnia,no irritability, no hopelessness, no perceptual disorder, no akathisia or EPS, no recent mania, and no suicidal ideation.The symptoms are aggravated by social issues, family issues, medication and work stress.Past treatments include SSRIs - Selective serotonin reuptake inhibitors, other medications and psychotherapy.Compliance with treatment is good.Past compliance problems include medical issues, medication issues and difficulty with treatment plan.Previous treatment provided moderaterelief.Risk factors include family history, major life event, stress and a recent illness. Past medical history includes physical disability,anxiety,bipolar disorder,depressionand mental health disorder. Pertinent negatives include no chronic illness,no recent illness,no life-threatening condition,no recent psychiatric admission,no eating disorder,no obsessive-compulsive disorder,no post-traumatic stress disorder,no schizophreniaand no head trauma.  Individual Medical History/ Review of Systems: Changes? 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  Weight recorded at 235 pounds on April 29 by neurosurgery having varied 221 and 240 the preceding several years. Cholesterol was 212 with LDL cholesterol 133 upper limit of normal 130 mg/dl on 7/61/9509 and maximum value in the last 5 years 147 mg/dL but most recent labs as recorded last appointment were intact except LDL cholesterol  elevated at 109 g/dL total cholesterol 326.  Allergies: Patient has no known allergies.  Current Medications:  Current Outpatient Medications:    buPROPion (WELLBUTRIN XL) 150 MG 24 hr tablet, Take 3 tablets (450 mg total) by mouth daily after breakfast., Disp: 270 tablet, Rfl: 1   Cariprazine HCl (VRAYLAR) 4.5 MG CAPS, Take 1 capsule (4.5 mg total) by mouth  daily after breakfast., Disp: 90 capsule, Rfl: 1   diazepam (VALIUM) 5 MG tablet, Take 5 mg by mouth 2 (two) times daily as needed for anxiety or agitation., Disp: , Rfl:    escitalopram (LEXAPRO) 20 MG tablet, Take 1 tablet (20 mg total) by mouth daily after breakfast., Disp: 90 tablet, Rfl: 1   HYDROcodone-acetaminophen (NORCO) 5-325 MG tablet, Take 1-2 tablets by mouth every 6 (six) hours as needed., Disp: 15 tablet, Rfl: 0   HYDROmorphone (DILAUDID) 2 MG tablet, Take 0.5 tablets (1 mg total) by mouth every 6 (six) hours as needed for severe pain., Disp: 10 tablet, Rfl: 0   lamoTRIgine (LAMICTAL) 200 MG tablet, Take 2 tablets (400 mg total) by mouth at bedtime., Disp: 180 tablet, Rfl: 1   methylphenidate (RITALIN) 10 MG tablet, Take 1 tablet (10 mg total) by mouth 2 (two) times daily with breakfast and lunch., Disp: 60 tablet, Rfl: 0   methylphenidate (RITALIN) 10 MG tablet, Take 1 tablet (10 mg total) by mouth 2 (two) times daily with breakfast and lunch., Disp: 60 tablet, Rfl: 0   methylphenidate (RITALIN) 10 MG tablet, Take 1 tablet (10 mg total) by mouth 2 (two) times daily with breakfast and lunch., Disp: 60 tablet, Rfl: 0   [START ON 05/09/2020] methylphenidate (RITALIN) 10 MG tablet, Take 1 tablet (10 mg total) by mouth 2 (two) times daily with breakfast and lunch., Disp: 60 tablet, Rfl: 0   [START ON 06/08/2020] methylphenidate (RITALIN) 10 MG tablet, Take 1 tablet (10 mg total) by mouth 2 (two) times daily with breakfast and lunch., Disp: 60 tablet, Rfl: 0   predniSONE (DELTASONE) 10 MG tablet, Take 2 tablets (20 mg  total) by mouth 2 (two) times daily., Disp: 20 tablet, Rfl: 0   Medication Side Effects: none  Family Medical/ Social History: Changes? No husband having RA and daughter major depression with social and generalized anxiety.  MENTAL HEALTH EXAM:  There were no vitals taken for this visit.There is no height or weight on file to calculate BMI. AIMS = 0  General Appearance: Casual, Meticulous, Well Groomed and Obese  Eye Contact:  Fair  Speech:  Clear and Coherent, Normal Rate and Talkative  Volume:  Normal  Mood:  Anxious, Depressed, Dysphoric and Euthymic  Affect:  Congruent, Inappropriate, Full Range and Anxious  Thought Process:  Coherent, Goal Directed, Irrelevant and Descriptions of Associations: Tangential  Orientation:  Full (Time, Place, and Person)  Thought Content: Ilusions, Rumination and Tangential   Suicidal Thoughts:  No  Homicidal Thoughts:  No  Memory:  Immediate;   Good Remote;   Good  Judgement:  Fair  Insight:  Fair  Psychomotor Activity:  Normal, Decreased and Mannerisms  Concentration:  Concentration: Fair and Attention Span: Good  Recall:  Good  Fund of Knowledge: Good  Language: Good  Assets:  Desire for Improvement Intimacy Resilience Talents/Skills  ADL's:  Intact  Cognition: WNL  Prognosis:  Fair    DIAGNOSES:    ICD-10-CM   1. Bipolar I disorder, moderate, current or most recent episode depressed, with anxious distress (HCC)  F31.32 methylphenidate (RITALIN) 10 MG tablet    buPROPion (WELLBUTRIN XL) 150 MG 24 hr tablet    Cariprazine HCl (VRAYLAR) 4.5 MG CAPS    escitalopram (LEXAPRO) 20 MG tablet    lamoTRIgine (LAMICTAL) 200 MG tablet  2. Generalized anxiety disorder  F41.1 buPROPion (WELLBUTRIN XL) 150 MG 24 hr tablet    escitalopram (LEXAPRO) 20 MG tablet    lamoTRIgine (  LAMICTAL) 200 MG tablet  3. Secondary neurodevelopmental disorder  F88 methylphenidate (RITALIN) 10 MG tablet    methylphenidate (RITALIN) 10 MG tablet    methylphenidate  (RITALIN) 10 MG tablet    buPROPion (WELLBUTRIN XL) 150 MG 24 hr tablet    Cariprazine HCl (VRAYLAR) 4.5 MG CAPS    escitalopram (LEXAPRO) 20 MG tablet    lamoTRIgine (LAMICTAL) 200 MG tablet  4. OSA (obstructive sleep apnea)  G47.33 methylphenidate (RITALIN) 10 MG tablet    methylphenidate (RITALIN) 10 MG tablet    buPROPion (WELLBUTRIN XL) 150 MG 24 hr tablet    Receiving Psychotherapy: Yes  [facility] weeks daughter apparently seeing Johnnette Barrios, [facility]    RECOMMENDATIONS: Psychosupportive psychoeducation reworks in cognitive behavioral therapy current stressors for collaboration with family members also having problems and medical and mental health providers.  She is satisfied with her current emotional and cognitive responses to stressors needing her active participation for recovery.  She continues current medications except not taking Valium 5 mg twice daily as needed last E scribed in June 2020 not listed in [facility] registry or needed further at this time.  She is E scribed to continue on work days Ritalin 10 mg IR  twice daily sent as per 60 each for July 19, August 18 and September 17 [facility] for fatigue and depression.  She is E scribed Lamictal 200 mg every bedtime sent as #90 with 1 refill to [facility] for bipolar disorder.  She is E scribed Lexapro 20 mg every morning sent as #90 with 1 [facility]ill to [facility] for generalized anxiety and bipolar depression.  She is E scribed Wellbutrin 150 mg XL taking 3 tablets total 450 mg XL every morning sent as #270 with 1 refill to [facility] for her developmental disorder and bipolar depression with generalized anxiety.  She is E scribed Vraylar 4.5 mg every morning sent as #90 with 1 refill to [facility] for bipolar depression.  She returns for follow-up in 3 to 6 months or sooner if needed.  Virtual Visit via  Video Note  I connected with Madison Henry on 04/09/20 at  4:00 PM EDT by a video enabled telemedicine application and verified that I am speaking with the correct person using two identifiers.  Location: Patient:  audiovisually individually in privacy at family home until she brings daughter in for daughter's appointment Provider: [facility]   I discussed the limitations of evaluation and management by telemedicine and the availability of in person appointments. The patient expressed understanding and agreed to proceed.  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  Multiple acute stressors with health, family, and work have occurred as she awaits her upcoming surgery on the upper back after emergency surgery July 4 on the low back.   Observations/Objective: Mood:  Anxious, Depressed, Dysphoric and Euthymic  Affect:  Congruent, Inappropriate, Full Range and Anxious  Thought Process:  Coherent, Goal Directed, Irrelevant and Descriptions of Associations: Tangential  Orientation:  Full (Time, Place, and Person)  Thought Content: Ilusions, Rumination and Tangential    Assessment and Plan: Psychosupportive psychoeducation reworks in cognitive behavioral therapy current stressors for collaboration with family members also having problems and medical and mental health providers.  She is satisfied with her current emotional and  cognitive responses to stressors needing her active participation for recovery.  She continues current medications except not taking Valium 5 mg twice daily as needed last E scribed in June 2020 not listed in [facility] registry or needed further at this time.  She is E scribed to continue on work days Ritalin 10 mg IR  twice daily sent as per 60 each for July 19, August 18 and September [facility] for fatigue and depression.  She is E scribed Lamictal 200 mg every bedtime sent as #90 with 1 refill to [facility] for bipolar disorder.  She is E scribed Lexapro 20 mg every morning sent as #90 with 1 refill to [facility] for generalized anxiety and bipolar depression.  She is E scribed Wellbutrin 150 mg XL taking 3 tablets total 450 mg XL every morning sent as #270 with 1 refill to [facility] for her developmental disorder and bipolar depression with generalized anxiety.  She is E scribed Vraylar 4.5 mg every morning sent as #90 with 1 refill to [facility] for bipolar depression.  Follow Up Instructions:  She returns for follow-up in 3 to 6 months or sooner if needed.   I discussed the assessment and treatment plan with the patient. The patient was provided an opportunity to ask questions and all were answered. The patient agreed with the plan and demonstrated an understanding of the instructions.   The patient was advised to call back or seek an in-person evaluation if the symptoms worsen or if the condition fails to improve as anticipated.  I provided 25 minutes of non-face-to-face time during this encounter.   Chauncey Mann, MD  Chauncey Mann, MD

## 2020-04-16 ENCOUNTER — Other Ambulatory Visit: Payer: Self-pay | Admitting: Psychiatry

## 2020-04-16 DIAGNOSIS — F3132 Bipolar disorder, current episode depressed, moderate: Secondary | ICD-10-CM

## 2020-04-17 ENCOUNTER — Other Ambulatory Visit: Payer: Self-pay | Admitting: Psychiatry

## 2020-04-17 DIAGNOSIS — F3132 Bipolar disorder, current episode depressed, moderate: Secondary | ICD-10-CM

## 2020-06-05 IMAGING — CT CT ANGIO CHEST
3 of 11 series · 17 of 36 positions shown · IV contrast (Omnipaque)
Comparison: None

CLINICAL DATA: Short of breath.

EXAM:
CT ANGIOGRAPHY CHEST WITH CONTRAST
TECHNIQUE: Multidetector CT imaging of the chest was performed using the
standard protocol during bolus administration of intravenous
contrast. Multiplanar CT image reconstructions and MIPs were
obtained to evaluate the vascular anatomy.
CONTRAST:  75mL OMNIPAQUE IOHEXOL 350 MG/ML SOLN

[Series 7: pe thins · axial · 0.79mm/px · z∈[-250,-10]mm · 14 of 278 slices shown]
[im 19/278  lung]
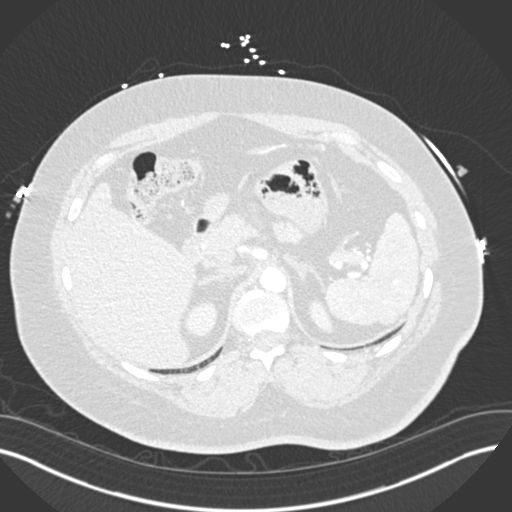
[im 37/278  mediastinal]
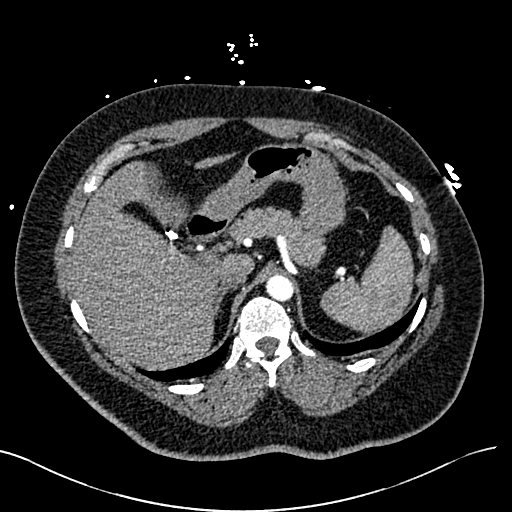
[im 56/278  lung]
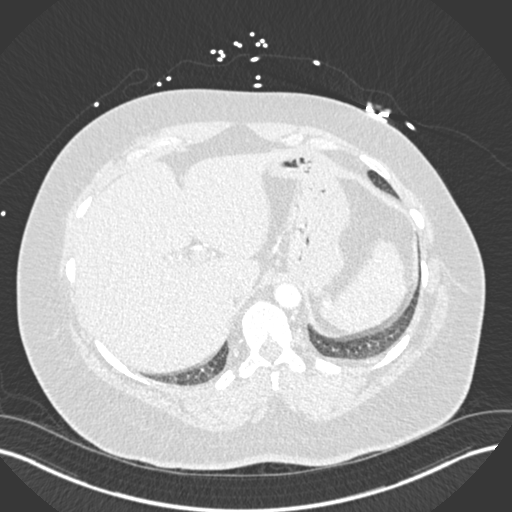
[im 74/278  mediastinal]
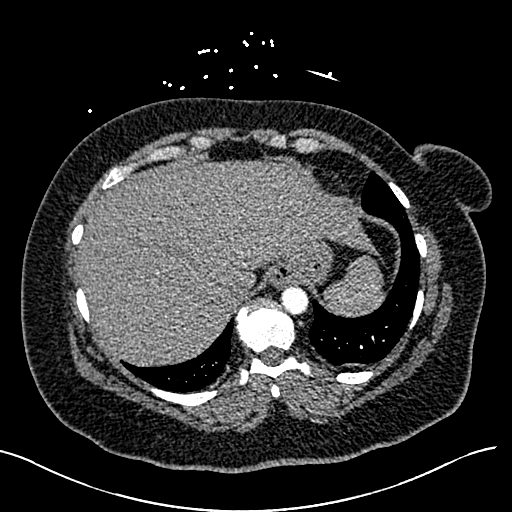
[im 93/278  lung]
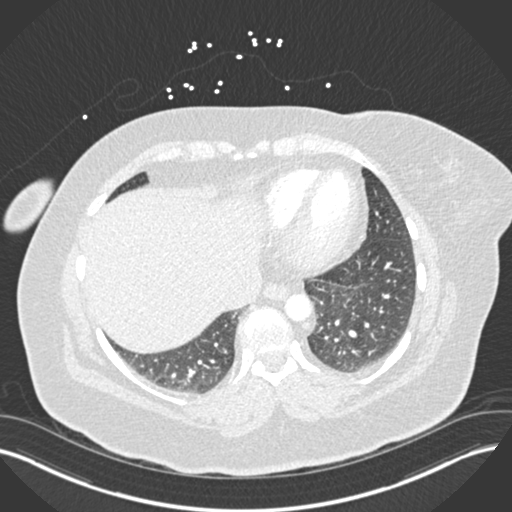
[im 111/278  mediastinal]
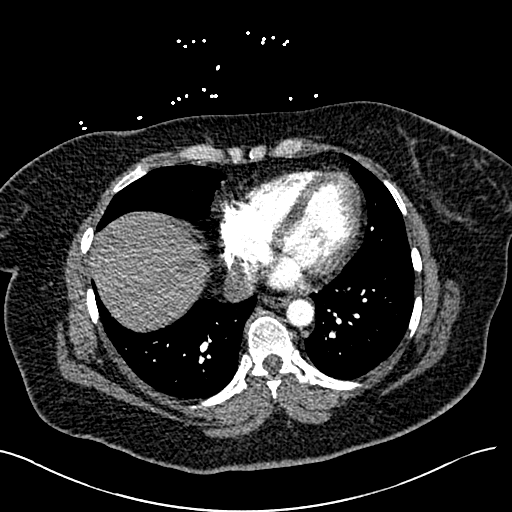
[im 130/278  lung]
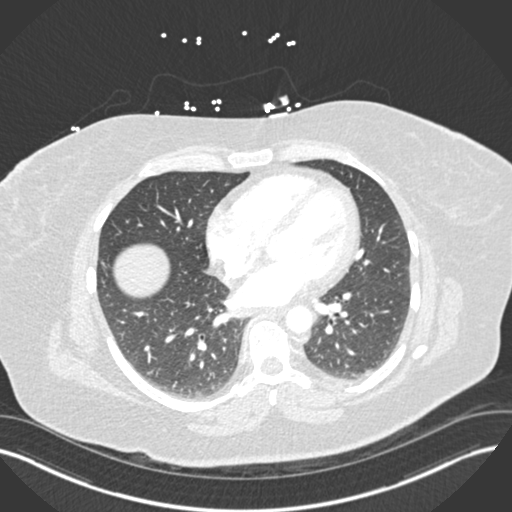
[im 148/278  mediastinal]
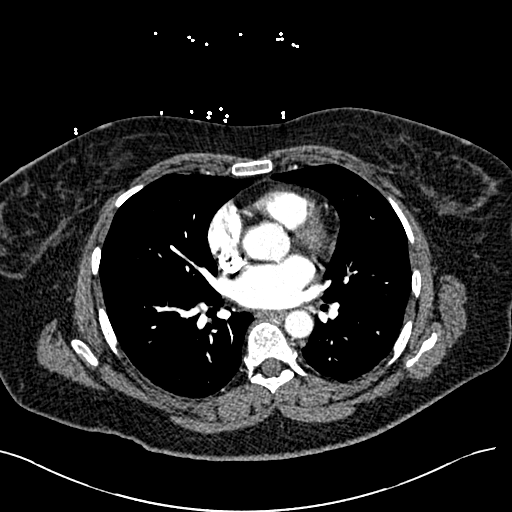
[im 167/278  lung]
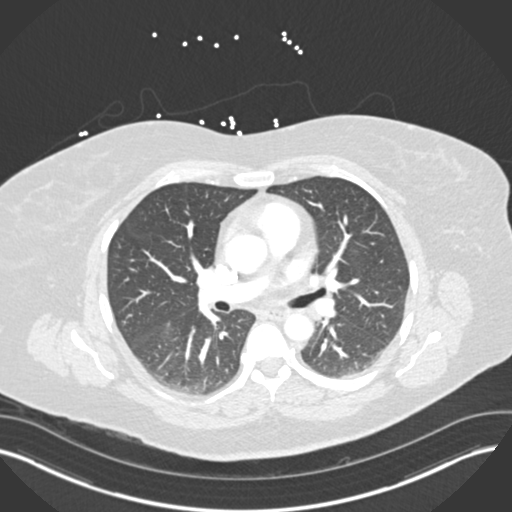
[im 185/278  mediastinal]
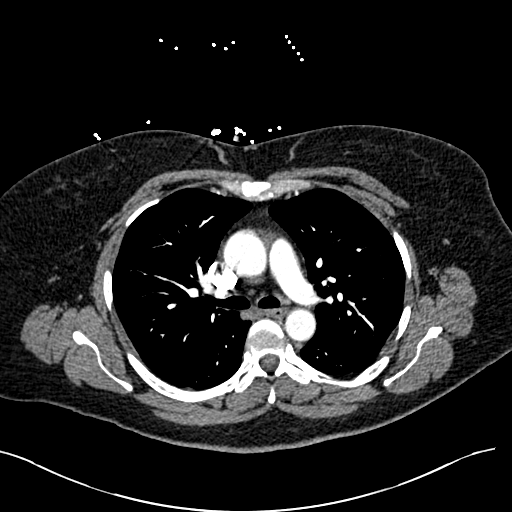
[im 204/278  lung]
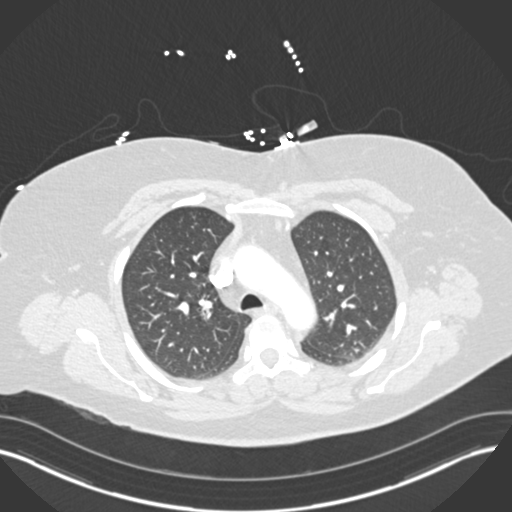
[im 222/278  mediastinal]
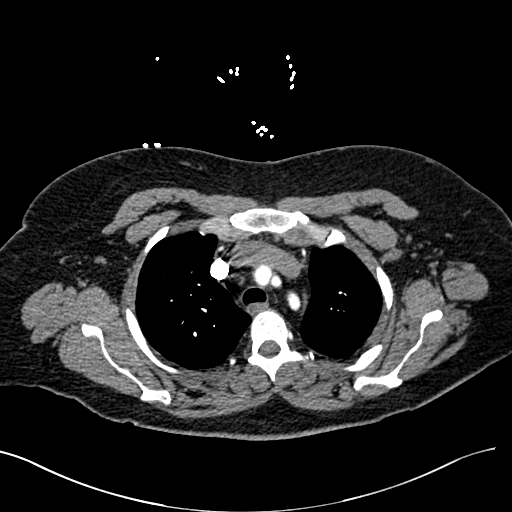
[im 241/278  lung]
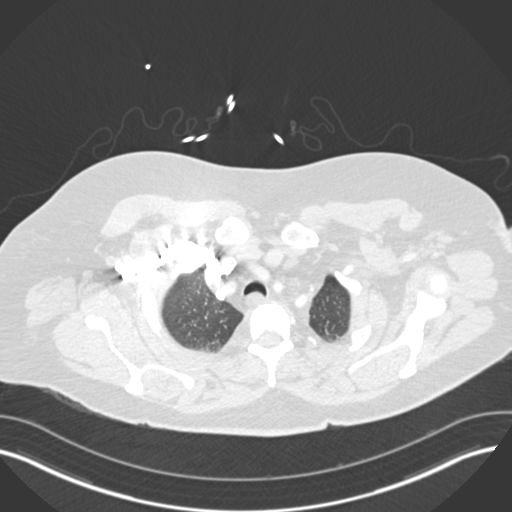
[im 259/278  mediastinal]
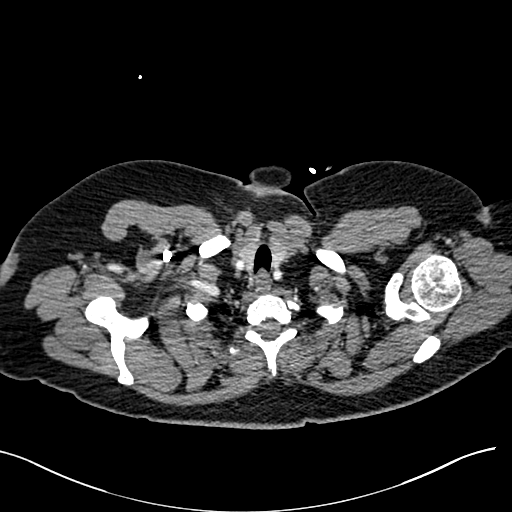

[Series 8: pe lung · axial · 0.95mm/px · z∈[-166,-78]mm · 2 of 87 slices shown]
[im 29/87  mediastinal]
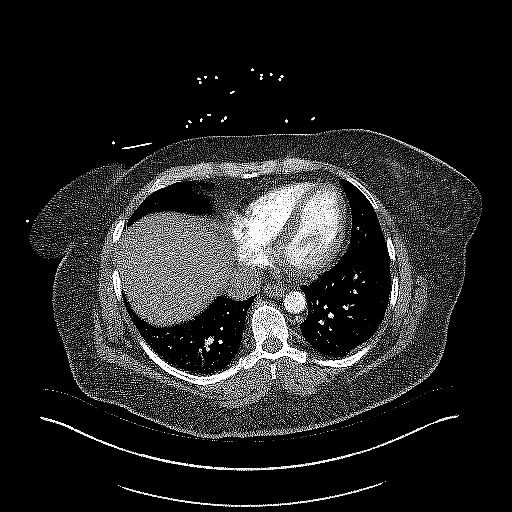
[im 58/87  mediastinal]
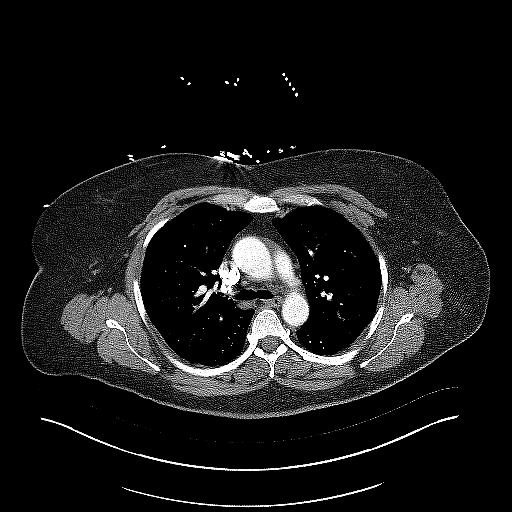

[Series 9: pe coronal mpr · coronal · 0.58mm/px · 1 of 148 slices shown]
[im 74/148  mediastinal]
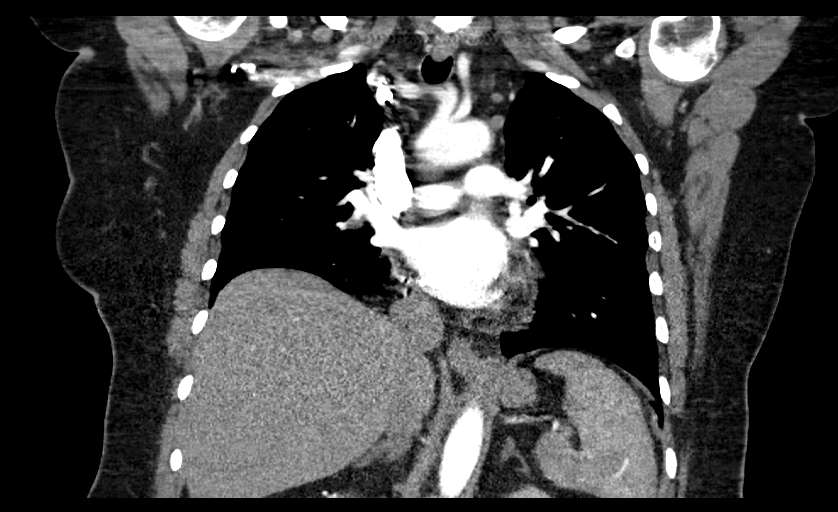

[17 of 36 positions shown; findings below may reference images not displayed]

FINDINGS: Cardiovascular: Satisfactory opacification of the pulmonary arteries
to the segmental level. No evidence of pulmonary embolism. Normal
heart size. No pericardial effusion.

Mediastinum/Nodes: No enlarged mediastinal, hilar, or axillary lymph
nodes. Thyroid gland, trachea, and esophagus demonstrate no
significant findings.

Lungs/Pleura: Lungs are clear. No pleural effusion or pneumothorax.

Upper Abdomen: No acute abnormality. Cholecystectomy.

Musculoskeletal: Mild thoracic spondylosis. No acute or significant
osseous findings.

Review of the MIP images confirms the above findings.
IMPRESSION: 1. No evidence for acute pulmonary embolus.
2. No active cardiopulmonary abnormalities.

## 2020-07-12 ENCOUNTER — Encounter: Payer: Self-pay | Admitting: Psychiatry

## 2020-10-07 ENCOUNTER — Other Ambulatory Visit: Payer: Self-pay | Admitting: Psychiatry

## 2020-10-07 DIAGNOSIS — F3132 Bipolar disorder, current episode depressed, moderate: Secondary | ICD-10-CM

## 2020-10-07 DIAGNOSIS — F411 Generalized anxiety disorder: Secondary | ICD-10-CM

## 2020-10-07 DIAGNOSIS — F88 Other disorders of psychological development: Secondary | ICD-10-CM

## 2020-10-09 ENCOUNTER — Other Ambulatory Visit: Payer: Self-pay | Admitting: Adult Health

## 2020-10-09 ENCOUNTER — Telehealth: Payer: Self-pay | Admitting: Adult Health

## 2020-10-09 DIAGNOSIS — F3132 Bipolar disorder, current episode depressed, moderate: Secondary | ICD-10-CM

## 2020-10-09 DIAGNOSIS — F88 Other disorders of psychological development: Secondary | ICD-10-CM

## 2020-10-09 MED ORDER — METHYLPHENIDATE HCL 10 MG PO TABS: 10.0000 mg | ORAL_TABLET | Freq: Two times a day (BID) | ORAL | 0 refills | Status: DC

## 2020-10-09 NOTE — Telephone Encounter (Signed)
Script sent  

## 2020-10-09 NOTE — Telephone Encounter (Signed)
Pt called and left a message stating that she needs a script of her ritalin 10 mg [facility] [place], She has an appt 10/29/20 with you. She is a pt of [facility]

## 2020-10-22 ENCOUNTER — Other Ambulatory Visit: Payer: Self-pay

## 2020-10-22 ENCOUNTER — Emergency Department (HOSPITAL_BASED_OUTPATIENT_CLINIC_OR_DEPARTMENT_OTHER): Payer: No Typology Code available for payment source

## 2020-10-22 ENCOUNTER — Encounter (HOSPITAL_BASED_OUTPATIENT_CLINIC_OR_DEPARTMENT_OTHER): Payer: Self-pay

## 2020-10-22 ENCOUNTER — Emergency Department (HOSPITAL_BASED_OUTPATIENT_CLINIC_OR_DEPARTMENT_OTHER)
Admission: EM | Admit: 2020-10-22 | Discharge: 2020-10-22 | Disposition: A | Discharge status: Home / Self Care | Payer: No Typology Code available for payment source | Attending: Emergency Medicine | Admitting: Emergency Medicine

## 2020-10-22 DIAGNOSIS — E860 Dehydration: Secondary | ICD-10-CM | POA: Diagnosis not present

## 2020-10-22 DIAGNOSIS — Z8673 Personal history of transient ischemic attack (TIA), and cerebral infarction without residual deficits: Secondary | ICD-10-CM | POA: Diagnosis not present

## 2020-10-22 DIAGNOSIS — R410 Disorientation, unspecified: Secondary | ICD-10-CM | POA: Diagnosis present

## 2020-10-22 DIAGNOSIS — R197 Diarrhea, unspecified: Secondary | ICD-10-CM | POA: Insufficient documentation

## 2020-10-22 DIAGNOSIS — R1110 Vomiting, unspecified: Secondary | ICD-10-CM | POA: Insufficient documentation

## 2020-10-22 LAB — URINALYSIS, ROUTINE W REFLEX MICROSCOPIC
Bilirubin Urine: NEGATIVE
Glucose, UA: NEGATIVE mg/dL
Hgb urine dipstick: NEGATIVE
Ketones, ur: NEGATIVE mg/dL
Leukocytes,Ua: NEGATIVE
Nitrite: NEGATIVE
Protein, ur: NEGATIVE mg/dL
Specific Gravity, Urine: 1.01 (ref 1.005–1.030)
pH: 6.5 (ref 5.0–8.0)

## 2020-10-22 LAB — CBC
HCT: 42.7 % (ref 36.0–46.0)
Hemoglobin: 14.4 g/dL (ref 12.0–15.0)
MCH: 30.2 pg (ref 26.0–34.0)
MCHC: 33.7 g/dL (ref 30.0–36.0)
MCV: 89.5 fL (ref 80.0–100.0)
Platelets: 278 10*3/uL (ref 150–400)
RBC: 4.77 MIL/uL (ref 3.87–5.11)
RDW: 12.4 % (ref 11.5–15.5)
WBC: 9.3 10*3/uL (ref 4.0–10.5)
nRBC: 0 % (ref 0.0–0.2)

## 2020-10-22 LAB — COMPREHENSIVE METABOLIC PANEL
ALT: 30 U/L (ref 0–44)
AST: 22 U/L (ref 15–41)
Albumin: 4.3 g/dL (ref 3.5–5.0)
Alkaline Phosphatase: 102 U/L (ref 38–126)
Anion gap: 11 (ref 5–15)
BUN: 12 mg/dL (ref 6–20)
CO2: 24 mmol/L (ref 22–32)
Calcium: 9.7 mg/dL (ref 8.9–10.3)
Chloride: 101 mmol/L (ref 98–111)
Creatinine, Ser: 0.83 mg/dL (ref 0.44–1.00)
GFR, Estimated: >60 mL/min (ref >60)
Glucose, Bld: 100 mg/dL — ABNORMAL HIGH (ref 70–99)
Potassium: 4 mmol/L (ref 3.5–5.1)
Sodium: 136 mmol/L (ref 135–145)
Total Bilirubin: 0.6 mg/dL (ref 0.3–1.2)
Total Protein: 7.3 g/dL (ref 6.5–8.1)

## 2020-10-22 LAB — PREGNANCY, URINE: Preg Test, Ur: NEGATIVE

## 2020-10-22 LAB — LIPASE, BLOOD: Lipase: 26 U/L (ref 11–51)

## 2020-10-22 MED ORDER — IOHEXOL 350 MG/ML SOLN
100.0000 mL | Freq: Once | INTRAVENOUS | Status: AC | PRN
  Administered 2020-10-22: 100 mL via INTRAVENOUS

## 2020-10-22 MED ORDER — SODIUM CHLORIDE 0.9 % IV BOLUS
1000.0000 mL | Freq: Once | INTRAVENOUS | Status: AC
  Administered 2020-10-22: 1000 mL via INTRAVENOUS

## 2020-10-22 MED ORDER — ASPIRIN 81 MG PO CHEW
324.0000 mg | CHEWABLE_TABLET | Freq: Once | ORAL | Status: AC
  Administered 2020-10-22: 324 mg via ORAL
  Filled 2020-10-22: qty 4

## 2020-10-22 MED ORDER — ASPIRIN 81 MG PO CHEW: 81.0000 mg | CHEWABLE_TABLET | Freq: Every day | ORAL | 0 refills | Status: AC

## 2020-10-22 NOTE — Discharge Instructions (Addendum)
You have been seen and discharged from the emergency department.  You were dehydrated, given IV fluids.  Your CT angio of the head and neck was normal, showed chronic changes on the right.  Follow-up with your primary provider and neurology for reevaluation and MRI which can further evaluate for CVA/TIA. Take new prescription of baby aspirin daily and home medications as prescribed. If you have any worsening symptoms or further concerns for health please return to an emergency department for further evaluation.

## 2020-10-22 NOTE — ED Provider Notes (Signed)
[facility] EMERGENCY DEPARTMENT Provider Note   CSN: [id number] Arrival date & time: 10/22/20  1150     History Chief Complaint  Patient presents with  . Vomiting    Madison Henry is a 50 y.o. female.  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 Initially to triage patient talked about a week long history of nausea/vomiting/diarrhea. She states that the symptoms have improved. However when she tried to go to work on Friday she describes a period of disorientation when she was typing words in the wrong words were appearing, she felt confused and disoriented so she went home. She called her primary doctor. She had blood work 3 days ago with an outpatient that showed mild dehydration. She had a head CT on Friday that showed no acute intracranial abnormality. She states she rested over the weekend without any issues. When she tried to go to work this morning again she felt very disoriented, she called her primary doctor in the triage note thought she had slurred speech to the center the emergency room for further evaluation. On arrival patient states she feels fatigued but otherwise baseline. No other acute neurologic symptoms. Her last episode of vomiting and diarrhea was this morning, for the last 6 hours she has had no emesis/loose stools. Denies any fever. Was tested for Covid as an outpatient which was negative.  Past Medical History:  Diagnosis Date  . Factor 5 Leiden mutation, heterozygous (HCC)   . Stroke [facility])     Patient Active Problem List   Diagnosis Date Noted  . Bipolar I disorder, moderate, current or most recent episode depressed, with anxious distress (HCC) 02/24/2019  . Generalized anxiety disorder 02/24/2019  . Secondary  neurodevelopmental disorder 02/24/2019  . OSA (obstructive sleep apnea) 01/05/2016    Past Surgical History:  Procedure Laterality Date  . ABDOMINAL SURGERY     3 c sections  . CHOLECYSTECTOMY       OB History   No obstetric history on file.     History reviewed. No pertinent family history.  Social History   Tobacco Use  . Smoking status: Never Smoker  . Smokeless tobacco: Never Used  Vaping Use  . Vaping Use: Never used  Substance Use Topics  . Alcohol use: Not Currently  . Drug use: Never    Home Medications Prior to Admission medications   Medication Sig Start Date End Date Taking? Authorizing Provider  buPROPion (WELLBUTRIN XL) 150 MG 24 hr tablet Take 3 tablets (450 mg total) by mouth daily after breakfast. 04/09/20   Chauncey Mann, MD  Cariprazine HCl (VRAYLAR) 4.5 MG CAPS Take 1 capsule (4.5 mg total) by mouth daily after breakfast. 04/09/20   Chauncey Mann, MD  diazepam (VALIUM) 5 MG tablet Take 5 mg by mouth 2 (two) times daily as needed for anxiety or agitation.    [provider]  escitalopram (LEXAPRO) 20 MG tablet Take 1 tablet (20 mg total) by mouth daily after breakfast. 04/09/20   Chauncey Mann, MD  HYDROcodone-acetaminophen (NORCO) 5-325 MG tablet Take 1-2 tablets by mouth every 6 (six) hours as needed. 03/20/19   Geoffery Lyons, MD  HYDROmorphone (DILAUDID) 2 MG tablet Take 0.5 tablets (1 mg total) by mouth every 6 (six) hours as needed for severe pain. 12/27/19   Long, Ivin Booty  G, MD  lamoTRIgine (LAMICTAL) 200 MG tablet TAKE 2 TABLETS (400 MG TOTAL) BY MOUTH AT BEDTIME. 10/08/20   Cottle, Steva Ready., MD  methylphenidate (RITALIN) 10 MG tablet Take 1 tablet (10 mg total) by mouth 2 (two) times daily with breakfast and lunch. 03/09/20 04/08/20  Chauncey Mann, MD  methylphenidate (RITALIN) 10 MG tablet Take 1 tablet (10 mg total) by mouth 2 (two) times daily with breakfast and lunch. 04/09/20 05/09/20  Chauncey Mann, MD  methylphenidate  (RITALIN) 10 MG tablet Take 1 tablet (10 mg total) by mouth 2 (two) times daily with breakfast and lunch. 05/09/20 06/08/20  Chauncey Mann, MD  methylphenidate (RITALIN) 10 MG tablet Take 1 tablet (10 mg total) by mouth 2 (two) times daily with breakfast and lunch. 06/08/20 07/08/20  Chauncey Mann, MD  methylphenidate (RITALIN) 10 MG tablet Take 1 tablet (10 mg total) by mouth 2 (two) times daily with breakfast and lunch. 10/09/20 11/08/20  Mozingo, Thereasa Solo, NP  predniSONE (DELTASONE) 10 MG tablet Take 2 tablets (20 mg total) by mouth 2 (two) times daily. 03/20/19   Geoffery Lyons, MD    Allergies    Patient has no known allergies.  Review of Systems   Review of Systems  Constitutional: Positive for fatigue. Negative for chills and fever.  HENT: Negative for congestion.   Eyes: Negative for visual disturbance.  Respiratory: Negative for shortness of breath.   Cardiovascular: Negative for chest pain.  Gastrointestinal: Positive for diarrhea and vomiting. Negative for abdominal pain.  Genitourinary: Negative for dysuria.  Skin: Negative for rash.  Neurological: Positive for speech difficulty. Negative for syncope, facial asymmetry, weakness, light-headedness, numbness and headaches.    Physical Exam Updated Vital Signs BP (S) (!) 118/93 (BP Location: Left Arm)   Pulse 84   Temp 98.7 F (37.1 C) (Oral)   Resp 16   Ht 5\' 5"  (1.651 m)   Wt 106.7 kg   SpO2 100%   BMI 39.14 kg/m   Physical Exam  ED Results / Procedures / Treatments   Labs (all labs ordered are listed, but only abnormal results are displayed) Labs Reviewed  COMPREHENSIVE METABOLIC PANEL - Abnormal; Notable for the following components:      Result Value   Glucose, Bld 100 (*)    All other components within normal limits  URINALYSIS, ROUTINE W REFLEX MICROSCOPIC - Abnormal; Notable for the following components:   APPearance HAZY (*)    All other components within normal limits  LIPASE, BLOOD  CBC   PREGNANCY, URINE    EKG None  Radiology No results found.  Procedures Procedures   Medications Ordered in ED Medications  aspirin chewable tablet 324 mg (324 mg Oral Given 10/22/20 1743)  iohexol (OMNIPAQUE) 350 MG/ML injection 100 mL (100 mLs Intravenous Contrast Given 10/22/20 1757)    ED Course  I have reviewed the triage vital signs and the nursing notes.  Pertinent labs & imaging results that were available during my care of the patient were reviewed by me and considered in my medical decision making (see chart for details).    MDM Rules/Calculators/A&P                          Vitals are stable on arrival. Abdomen is soft. Lab evaluation does not show any acute abnormalities, however patient is reporting neurologic symptoms to me and not GI symptoms. She does have some ataxia in the left upper  extremity but she has residual deficits on the left side with spasticity from stroke as a baby. Noted to be factor V Leiden heterozygous, not on AC. Spoke with on-call neurologist Dr. Thomasena Edis, our plan is to do a CTA of the head and neck to evaluate for any vascular abnormality. If this is negative, given that she has no delta NIH or acute neurologic symptoms at this time we would most likely evaluate as an outpatient for TIA with MRI. Of note patient's orthostatic vital signs are positive when going from sitting to standing. Will hydrate and obtain imaging.  Patient feels improved after hydration.  CTA of the head and neck shows no vascular abnormality, does identify right-sided gliosis and calcification with mild ventricular dilation, this was present on her outpatient scan on Friday, these are chronic findings.  Patient continues to have a normal neurologic exam.  Plan for outpatient follow-up with primary and neurology for further evaluation with MRI.  No need for emergent MRI at this time.  We will place her on a baby aspirin until TIA/CVA is ruled out.  Patient will be discharged and  treated as an outpatient.  Discharge plan and strict return to ED precautions discussed, patient verbalizes understanding and agreement.  Final Clinical Impression(s) / ED Diagnoses Final diagnoses:  None    Rx / DC Orders ED Discharge Orders    None       Rozelle Logan, DO 10/22/20 2004

## 2020-10-22 NOTE — ED Triage Notes (Signed)
Pt c/o n/v/d x 8 days-states she was seen by PCP last week-states she felt "disoriented" at work 1/28-states she had CT/labs done per PCP order 1/28 that she was advised "unremarkable"-states same sx started again today at [facility] contacted PC and advised to come to ED-pt A/O-NAD-steady

## 2020-10-29 ENCOUNTER — Ambulatory Visit: Payer: 59 | Admitting: Adult Health

## 2020-11-21 ENCOUNTER — Ambulatory Visit: Payer: Self-pay | Admitting: Adult Health

## 2020-12-19 ENCOUNTER — Other Ambulatory Visit: Payer: Self-pay

## 2020-12-19 ENCOUNTER — Encounter: Payer: Self-pay | Admitting: Adult Health

## 2020-12-19 ENCOUNTER — Ambulatory Visit (INDEPENDENT_AMBULATORY_CARE_PROVIDER_SITE_OTHER): Payer: No Typology Code available for payment source | Admitting: Adult Health

## 2020-12-19 DIAGNOSIS — G4733 Obstructive sleep apnea (adult) (pediatric): Secondary | ICD-10-CM | POA: Diagnosis not present

## 2020-12-19 DIAGNOSIS — F3132 Bipolar disorder, current episode depressed, moderate: Secondary | ICD-10-CM | POA: Diagnosis not present

## 2020-12-19 DIAGNOSIS — F411 Generalized anxiety disorder: Secondary | ICD-10-CM

## 2020-12-19 DIAGNOSIS — F88 Other disorders of psychological development: Secondary | ICD-10-CM | POA: Diagnosis not present

## 2020-12-19 MED ORDER — METHYLPHENIDATE HCL 10 MG PO TABS: 10.0000 mg | ORAL_TABLET | Freq: Two times a day (BID) | ORAL | 0 refills | Status: DC

## 2020-12-19 MED ORDER — DIAZEPAM 5 MG PO TABS: 5.0000 mg | ORAL_TABLET | Freq: Two times a day (BID) | ORAL | 2 refills | Status: DC | PRN

## 2020-12-19 MED ORDER — ESCITALOPRAM OXALATE 20 MG PO TABS
20.0000 mg | ORAL_TABLET | Freq: Every day | ORAL | 3 refills | Status: DC
Start: 2020-12-19 — End: 2022-01-10

## 2020-12-19 MED ORDER — VRAYLAR 4.5 MG PO CAPS: 4.5000 mg | ORAL_CAPSULE | Freq: Every day | ORAL | 3 refills | Status: DC

## 2020-12-19 MED ORDER — LAMOTRIGINE 200 MG PO TABS: 400.0000 mg | ORAL_TABLET | Freq: Every day | ORAL | 3 refills | Status: DC

## 2020-12-19 MED ORDER — BUPROPION HCL ER (XL) 150 MG PO TB24: 450.0000 mg | ORAL_TABLET | Freq: Every day | ORAL | 3 refills | Status: DC

## 2020-12-19 NOTE — Progress Notes (Signed)
Madison Henry [id number] 03-Sep-1971 50 y.o.  Subjective:   Patient ID:  Madison Henry is a 50 y.o. (DOB 06/01/1971) female.  Chief Complaint: No chief complaint on file.   HPI Madison Henry presents to the office today for follow-up of BPD-1, OSA, GAD, secondary   Describes mood today as "ok". Pleasant. Mood symptoms - reports some depression - "it's always there". Husband concerned that she doesn't get up and about enough. Denies anxiety. Feels irritable at times. Feels like medications are keeping her "stable". Stable interest and motivation. Taking medications as prescribed.  Energy levels stable. Active, does not have a regular exercise routine.  Enjoys some usual interests and activities. Married. Lives with husband. Has 3 children 26, 18, and 14. Spending time with family.  Appetite adequate. Weight stable - 240 pounds. Sleeps well most nights. Averages 7 hours. Has sleep apnea - cannot use CPAP machine. Focus and concentration stable - "that's better than it had been". Completing tasks. Managing aspects of household. Works full-time as an Airline pilot. Denies SI or HI.  Denies AH or VH.  Previous medication trials: Denies   Flowsheet Row ED from 10/22/2020 in [facility]  C-SSRS RISK CATEGORY Error: Question 6 not populated       Review of Systems:  Review of Systems  Musculoskeletal: Negative for gait problem.  Neurological: Negative for tremors.  Psychiatric/Behavioral:       Please refer to HPI    Medications: I have reviewed the patient's current medications.  Current Outpatient Medications  Medication Sig Dispense Refill  . aspirin 81 MG chewable tablet Chew 1 tablet (81 mg total) by mouth daily. 30 tablet 0  . buPROPion (WELLBUTRIN XL) 150 MG 24 hr tablet Take 3 tablets (450 mg total) by mouth daily after breakfast. 270 tablet 3  . Cariprazine HCl (VRAYLAR) 4.5 MG CAPS Take 1 capsule (4.5 mg total) by mouth daily  after breakfast. 90 capsule 3  . diazepam (VALIUM) 5 MG tablet Take 1 tablet (5 mg total) by mouth 2 (two) times daily as needed. 30 tablet 2  . escitalopram (LEXAPRO) 20 MG tablet Take 1 tablet (20 mg total) by mouth daily after breakfast. 90 tablet 3  . HYDROcodone-acetaminophen (NORCO) 5-325 MG tablet Take 1-2 tablets by mouth every 6 (six) hours as needed. 15 tablet 0  . HYDROmorphone (DILAUDID) 2 MG tablet Take 0.5 tablets (1 mg total) by mouth every 6 (six) hours as needed for severe pain. 10 tablet 0  . lamoTRIgine (LAMICTAL) 200 MG tablet Take 2 tablets (400 mg total) by mouth at bedtime. 180 tablet 3  . methylphenidate (RITALIN) 10 MG tablet Take 1 tablet (10 mg total) by mouth 2 (two) times daily with breakfast and lunch. 60 tablet 0  . methylphenidate (RITALIN) 10 MG tablet Take 1 tablet (10 mg total) by mouth 2 (two) times daily with breakfast and lunch. 60 tablet 0  . methylphenidate (RITALIN) 10 MG tablet Take 1 tablet (10 mg total) by mouth 2 (two) times daily with breakfast and lunch. 60 tablet 0  . [START ON 01/16/2021] methylphenidate (RITALIN) 10 MG tablet Take 1 tablet (10 mg total) by mouth 2 (two) times daily with breakfast and lunch. 60 tablet 0  . [START ON 02/13/2021] methylphenidate (RITALIN) 10 MG tablet Take 1 tablet (10 mg total) by mouth 2 (two) times daily with breakfast and lunch. 60 tablet 0  . predniSONE (DELTASONE) 10 MG tablet Take 2 tablets (20 mg total) by mouth 2 (two) times  daily. 20 tablet 0   No current facility-administered medications for this visit.    Medication Side Effects: None  Allergies: No Known Allergies  Past Medical History:  Diagnosis Date  . Factor 5 Leiden mutation, heterozygous (HCC)   . Stroke [facility])     No family history on file.  Social History   Socioeconomic History  . Marital status: Married    Spouse name: Not on file  . Number of children: Not on file  . Years of education: Not on file  . Highest education level: Not  on file  Occupational History  . Not on file  Tobacco Use  . Smoking status: Never Smoker  . Smokeless tobacco: Never Used  Vaping Use  . Vaping Use: Never used  Substance and Sexual Activity  . Alcohol use: Not Currently  . Drug use: Never  . Sexual activity: Not on file  Other Topics Concern  . Not on file  Social History Narrative  . Not on file   Social Determinants of Health   Financial Resource Strain: Not on file  Food Insecurity: Not on file  Transportation Needs: Not on file  Physical Activity: Not on file  Stress: Not on file  Social Connections: Not on file  Intimate Partner Violence: Not on file    Past Medical History, Surgical history, Social history, and Family history were reviewed and updated as appropriate.   Please see review of systems for further details on the patient's review from today.   Objective:   Physical Exam:  There were no vitals taken for this visit.  Physical Exam Constitutional:      General: She is not in acute distress. Musculoskeletal:        General: No deformity.  Neurological:     Mental Status: She is alert and oriented to person, place, and time.     Coordination: Coordination normal.  Psychiatric:        Attention and Perception: Attention and perception normal. She does not perceive auditory or visual hallucinations.        Mood and Affect: Mood normal. Mood is not anxious or depressed. Affect is not labile, blunt, angry or inappropriate.        Speech: Speech normal.        Behavior: Behavior normal.        Thought Content: Thought content normal. Thought content is not paranoid or delusional. Thought content does not include homicidal or suicidal ideation. Thought content does not include homicidal or suicidal plan.        Cognition and Memory: Cognition and memory normal.        Judgment: Judgment normal.     Comments: Insight intact     Lab Review:     Component Value Date/Time   NA 136 10/22/2020 1226   K  4.0 10/22/2020 1226   CL 101 10/22/2020 1226   CO2 24 10/22/2020 1226   GLUCOSE 100 (H) 10/22/2020 1226   BUN 12 10/22/2020 1226   CREATININE 0.83 10/22/2020 1226   CALCIUM 9.7 10/22/2020 1226   PROT 7.3 10/22/2020 1226   ALBUMIN 4.3 10/22/2020 1226   AST 22 10/22/2020 1226   ALT 30 10/22/2020 1226   ALKPHOS 102 10/22/2020 1226   BILITOT 0.6 10/22/2020 1226   GFRNONAA >60 10/22/2020 1226   GFRAA >60 12/27/2019 2003       Component Value Date/Time   WBC 9.3 10/22/2020 1226   RBC 4.77 10/22/2020 1226   HGB 14.4 10/22/2020  1226   HCT 42.7 10/22/2020 1226   PLT 278 10/22/2020 1226   MCV 89.5 10/22/2020 1226   MCH 30.2 10/22/2020 1226   MCHC 33.7 10/22/2020 1226   RDW 12.4 10/22/2020 1226   LYMPHSABS 2.5 12/27/2019 2003   MONOABS 0.8 12/27/2019 2003   EOSABS 0.3 12/27/2019 2003   BASOSABS 0.0 12/27/2019 2003    No results found for: POCLITH, LITHIUM   No results found for: PHENYTOIN, PHENOBARB, VALPROATE, CBMZ   .res Assessment: Plan:    Plan:  PDMP reviewed  1. Valium 5 mg twice daily as needed  2. Ritalin 10 mg IR twice daily 3  Lamictal 200 mg every bedtime  4. Lexapro 20 mg every morning  5. Wellbutrin 150 mg XL taking 3 tablets total 450 mg XL every morning  6. Vraylar 4.5 mg every morning   101/66 - 72  Discussed seeing PCP for labs - TSH, B12, and Vitamin D - low mood and low energy. Patient to call with update.  Discussed Nuvigil for OSA.  RTC 6 months  Patient advised to contact office with any questions, adverse effects, or acute worsening in signs and symptoms.    Diagnoses and all orders for this visit:  Bipolar I disorder, moderate, current or most recent episode depressed, with anxious distress (HCC) -     buPROPion (WELLBUTRIN XL) 150 MG 24 hr tablet; Take 3 tablets (450 mg total) by mouth daily after breakfast. -     Cariprazine HCl (VRAYLAR) 4.5 MG CAPS; Take 1 capsule (4.5 mg total) by mouth daily after breakfast. -     escitalopram  (LEXAPRO) 20 MG tablet; Take 1 tablet (20 mg total) by mouth daily after breakfast. -     methylphenidate (RITALIN) 10 MG tablet; Take 1 tablet (10 mg total) by mouth 2 (two) times daily with breakfast and lunch. -     methylphenidate (RITALIN) 10 MG tablet; Take 1 tablet (10 mg total) by mouth 2 (two) times daily with breakfast and lunch. -     lamoTRIgine (LAMICTAL) 200 MG tablet; Take 2 tablets (400 mg total) by mouth at bedtime.  Secondary neurodevelopmental disorder -     buPROPion (WELLBUTRIN XL) 150 MG 24 hr tablet; Take 3 tablets (450 mg total) by mouth daily after breakfast. -     Cariprazine HCl (VRAYLAR) 4.5 MG CAPS; Take 1 capsule (4.5 mg total) by mouth daily after breakfast. -     escitalopram (LEXAPRO) 20 MG tablet; Take 1 tablet (20 mg total) by mouth daily after breakfast. -     methylphenidate (RITALIN) 10 MG tablet; Take 1 tablet (10 mg total) by mouth 2 (two) times daily with breakfast and lunch. -     methylphenidate (RITALIN) 10 MG tablet; Take 1 tablet (10 mg total) by mouth 2 (two) times daily with breakfast and lunch. -     methylphenidate (RITALIN) 10 MG tablet; Take 1 tablet (10 mg total) by mouth 2 (two) times daily with breakfast and lunch. -     lamoTRIgine (LAMICTAL) 200 MG tablet; Take 2 tablets (400 mg total) by mouth at bedtime.  Generalized anxiety disorder -     diazepam (VALIUM) 5 MG tablet; Take 1 tablet (5 mg total) by mouth 2 (two) times daily as needed. -     buPROPion (WELLBUTRIN XL) 150 MG 24 hr tablet; Take 3 tablets (450 mg total) by mouth daily after breakfast. -     escitalopram (LEXAPRO) 20 MG tablet; Take 1 tablet (20  mg total) by mouth daily after breakfast. -     lamoTRIgine (LAMICTAL) 200 MG tablet; Take 2 tablets (400 mg total) by mouth at bedtime.  OSA (obstructive sleep apnea) -     buPROPion (WELLBUTRIN XL) 150 MG 24 hr tablet; Take 3 tablets (450 mg total) by mouth daily after breakfast. -     methylphenidate (RITALIN) 10 MG tablet; Take  1 tablet (10 mg total) by mouth 2 (two) times daily with breakfast and lunch.     Please see After Visit Summary for patient specific instructions.  Future Appointments  Date Time Provider Department Center  06/21/2021  4:20 PM Ireanna Finlayson, Thereasa Solo, NP CP-CP None    No orders of the defined types were placed in this encounter.   -------------------------------

## 2020-12-28 ENCOUNTER — Telehealth: Payer: Self-pay

## 2020-12-28 NOTE — Telephone Encounter (Signed)
Prior Approval received for VRAYLAR 4.5 MG effective 12/26/2020-12/26/2021 with [address]

## 2021-05-09 ENCOUNTER — Other Ambulatory Visit: Payer: Self-pay

## 2021-05-09 ENCOUNTER — Telehealth: Payer: Self-pay | Admitting: Adult Health

## 2021-05-09 DIAGNOSIS — G4733 Obstructive sleep apnea (adult) (pediatric): Secondary | ICD-10-CM

## 2021-05-09 DIAGNOSIS — F88 Other disorders of psychological development: Secondary | ICD-10-CM

## 2021-05-09 NOTE — Telephone Encounter (Signed)
Next visit is 06/21/21. Madison Henry called [facility] to get a refill on her Ritalin but the pharmacy said the refills had expired. She is requesting a new refill on Ritalin 10 mg called to:  [id number] - [place], [place] - [address]

## 2021-05-09 NOTE — Telephone Encounter (Signed)
Pended.

## 2021-05-10 ENCOUNTER — Other Ambulatory Visit: Payer: Self-pay | Admitting: Psychiatry

## 2021-05-10 DIAGNOSIS — G4733 Obstructive sleep apnea (adult) (pediatric): Secondary | ICD-10-CM

## 2021-05-10 DIAGNOSIS — F88 Other disorders of psychological development: Secondary | ICD-10-CM

## 2021-05-10 MED ORDER — METHYLPHENIDATE HCL 10 MG PO TABS
10.0000 mg | ORAL_TABLET | Freq: Two times a day (BID) | ORAL | 0 refills | Status: DC
Start: 2021-05-10 — End: 2021-10-15

## 2021-05-10 MED ORDER — METHYLPHENIDATE HCL 10 MG PO TABS: 10.0000 mg | ORAL_TABLET | Freq: Two times a day (BID) | ORAL | 0 refills | Status: DC

## 2021-05-10 NOTE — Telephone Encounter (Signed)
Pt called on status of Ritalin Rx to [facility] [place]. Please check on this.

## 2021-05-10 NOTE — Telephone Encounter (Signed)
Can this be approved for gina?

## 2021-05-10 NOTE — Telephone Encounter (Signed)
RX sent to [facility]

## 2021-06-21 ENCOUNTER — Ambulatory Visit: Payer: No Typology Code available for payment source | Admitting: Adult Health

## 2021-07-12 ENCOUNTER — Other Ambulatory Visit: Payer: Self-pay

## 2021-07-12 ENCOUNTER — Encounter: Payer: Self-pay | Admitting: Adult Health

## 2021-07-12 ENCOUNTER — Ambulatory Visit (INDEPENDENT_AMBULATORY_CARE_PROVIDER_SITE_OTHER): Payer: No Typology Code available for payment source | Admitting: Adult Health

## 2021-07-12 DIAGNOSIS — F88 Other disorders of psychological development: Secondary | ICD-10-CM

## 2021-07-12 DIAGNOSIS — F411 Generalized anxiety disorder: Secondary | ICD-10-CM

## 2021-07-12 DIAGNOSIS — F3132 Bipolar disorder, current episode depressed, moderate: Secondary | ICD-10-CM | POA: Diagnosis not present

## 2021-07-12 DIAGNOSIS — G4733 Obstructive sleep apnea (adult) (pediatric): Secondary | ICD-10-CM

## 2021-07-12 MED ORDER — METHYLPHENIDATE HCL 10 MG PO TABS: 10.0000 mg | ORAL_TABLET | Freq: Two times a day (BID) | ORAL | 0 refills | Status: DC

## 2021-07-12 NOTE — Progress Notes (Signed)
Madison Henry [id number] 06-28-1971 50 y.o.  Virtual Visit via Telephone Note  I connected with pt on 07/12/21 at  4:00 PM EDT by telephone and verified that I am speaking with the correct person using two identifiers.   I discussed the limitations, risks, security and privacy concerns of performing an evaluation and management service by telephone and the availability of in person appointments. I also discussed with the patient that there may be a patient responsible charge related to this service. The patient expressed understanding and agreed to proceed.   I discussed the assessment and treatment plan with the patient. The patient was provided an opportunity to ask questions and all were answered. The patient agreed with the plan and demonstrated an understanding of the instructions.   The patient was advised to call back or seek an in-person evaluation if the symptoms worsen or if the condition fails to improve as anticipated.  I provided 25 minutes of non-face-to-face time during this encounter.  The patient was located at home.  The provider was located at [facility].   Dorothyann Gibbs, NP   Subjective:   Patient ID:  Madison Henry is a 50 y.o. (DOB [birth year]) female.  Chief Complaint: No chief complaint on file.   HPI Madison Henry presents for follow-up of BPD-1, OSA, GAD, secondary   Describes mood today as "ok". Pleasant. Mood symptoms - reports some depression - "feels kinda meh". Denies anxiety. Feels irritable at times. Stating "I'm doing ok". Feels like medications are helpful - "I'm staying in the middle of the road".  Recently lost father. Stable interest and motivation. Taking medications as prescribed. Stable interest and motivation. Taking medications as prescribed.  Energy levels stable. Active, does not have a regular exercise routine.  Enjoys some usual interests and activities. Married. Lives with husband. Has 3 children 26, 18,  and 14. Spending time with family.  Appetite adequate. Weight stable - 240 pounds. Sleeps well most nights. Averages 8 hours. Has sleep apnea - cannot use CPAP machine. Focus and concentration stable - "ok until I get tired". Completing tasks. Managing aspects of household. Works full-time as an Airline pilot. Denies SI or HI.  Denies AH or VH.  Previous medication trials: Denies   Review of Systems:  Review of Systems  Musculoskeletal:  Negative for gait problem.  Neurological:  Negative for tremors.  Psychiatric/Behavioral:         Please refer to HPI   Medications: I have reviewed the patient's current medications.  Current Outpatient Medications  Medication Sig Dispense Refill   aspirin 81 MG chewable tablet Chew 1 tablet (81 mg total) by mouth daily. 30 tablet 0   buPROPion (WELLBUTRIN XL) 150 MG 24 hr tablet Take 3 tablets (450 mg total) by mouth daily after breakfast. 270 tablet 3   Cariprazine HCl (VRAYLAR) 4.5 MG CAPS Take 1 capsule (4.5 mg total) by mouth daily after breakfast. 90 capsule 3   diazepam (VALIUM) 5 MG tablet Take 1 tablet (5 mg total) by mouth 2 (two) times daily as needed. 30 tablet 2   escitalopram (LEXAPRO) 20 MG tablet Take 1 tablet (20 mg total) by mouth daily after breakfast. 90 tablet 3   HYDROcodone-acetaminophen (NORCO) 5-325 MG tablet Take 1-2 tablets by mouth every 6 (six) hours as needed. 15 tablet 0   HYDROmorphone (DILAUDID) 2 MG tablet Take 0.5 tablets (1 mg total) by mouth every 6 (six) hours as needed for severe pain. 10 tablet 0   lamoTRIgine (LAMICTAL) 200  MG tablet Take 2 tablets (400 mg total) by mouth at bedtime. 180 tablet 3   methylphenidate (RITALIN) 10 MG tablet Take 1 tablet (10 mg total) by mouth 2 (two) times daily with breakfast and lunch. 60 tablet 0   methylphenidate (RITALIN) 10 MG tablet Take 1 tablet (10 mg total) by mouth 2 (two) times daily with breakfast and lunch. 60 tablet 0   methylphenidate (RITALIN) 10 MG tablet Take 1  tablet (10 mg total) by mouth 2 (two) times daily with breakfast and lunch. 60 tablet 0   [START ON 08/09/2021] methylphenidate (RITALIN) 10 MG tablet Take 1 tablet (10 mg total) by mouth 2 (two) times daily with breakfast and lunch. 60 tablet 0   [START ON 09/06/2021] methylphenidate (RITALIN) 10 MG tablet Take 1 tablet (10 mg total) by mouth 2 (two) times daily with breakfast and lunch. 60 tablet 0   predniSONE (DELTASONE) 10 MG tablet Take 2 tablets (20 mg total) by mouth 2 (two) times daily. 20 tablet 0   No current facility-administered medications for this visit.    Medication Side Effects: None  Allergies: No Known Allergies  Past Medical History:  Diagnosis Date   Factor 5 Leiden mutation, heterozygous (HCC)    Stroke (HCC)     No family history on file.  Social History   Socioeconomic History   Marital status: Married    Spouse name: Not on file   Number of children: Not on file   Years of education: Not on file   Highest education level: Not on file  Occupational History   Not on file  Tobacco Use   Smoking status: Never   Smokeless tobacco: Never  Vaping Use   Vaping Use: Never used  Substance and Sexual Activity   Alcohol use: Not Currently   Drug use: Never   Sexual activity: Not on file  Other Topics Concern   Not on file  Social History Narrative   Not on file   Social Determinants of Health   Financial Resource Strain: Not on file  Food Insecurity: Not on file  Transportation Needs: Not on file  Physical Activity: Not on file  Stress: Not on file  Social Connections: Not on file  Intimate Partner Violence: Not on file    Past Medical History, Surgical history, Social history, and Family history were reviewed and updated as appropriate.   Please see review of systems for further details on the patient's review from today.   Objective:   Physical Exam:  There were no vitals taken for this visit.  Physical Exam Constitutional:       General: She is not in acute distress. Musculoskeletal:        General: No deformity.  Neurological:     Mental Status: She is alert and oriented to person, place, and time.     Coordination: Coordination normal.  Psychiatric:        Attention and Perception: Attention and perception normal. She does not perceive auditory or visual hallucinations.        Mood and Affect: Mood normal. Mood is not anxious or depressed. Affect is not labile, blunt, angry or inappropriate.        Speech: Speech normal.        Behavior: Behavior normal.        Thought Content: Thought content normal. Thought content is not paranoid or delusional. Thought content does not include homicidal or suicidal ideation. Thought content does not include homicidal or suicidal plan.  Cognition and Memory: Cognition and memory normal.        Judgment: Judgment normal.     Comments: Insight intact    Lab Review:     Component Value Date/Time   NA 136 10/22/2020 1226   K 4.0 10/22/2020 1226   CL 101 10/22/2020 1226   CO2 24 10/22/2020 1226   GLUCOSE 100 (H) 10/22/2020 1226   BUN 12 10/22/2020 1226   CREATININE 0.83 10/22/2020 1226   CALCIUM 9.7 10/22/2020 1226   PROT 7.3 10/22/2020 1226   ALBUMIN 4.3 10/22/2020 1226   AST 22 10/22/2020 1226   ALT 30 10/22/2020 1226   ALKPHOS 102 10/22/2020 1226   BILITOT 0.6 10/22/2020 1226   GFRNONAA >60 10/22/2020 1226   GFRAA >60 12/27/2019 2003       Component Value Date/Time   WBC 9.3 10/22/2020 1226   RBC 4.77 10/22/2020 1226   HGB 14.4 10/22/2020 1226   HCT 42.7 10/22/2020 1226   PLT 278 10/22/2020 1226   MCV 89.5 10/22/2020 1226   MCH 30.2 10/22/2020 1226   MCHC 33.7 10/22/2020 1226   RDW 12.4 10/22/2020 1226   LYMPHSABS 2.5 12/27/2019 2003   MONOABS 0.8 12/27/2019 2003   EOSABS 0.3 12/27/2019 2003   BASOSABS 0.0 12/27/2019 2003    No results found for: POCLITH, LITHIUM   No results found for: PHENYTOIN, PHENOBARB, VALPROATE, CBMZ    .res Assessment: Plan:     Plan:  PDMP reviewed  1. Valium 5 mg twice daily as needed  2. Ritalin 10 mg IR twice daily 3  Lamictal 200 mg every bedtime  4. Lexapro 20 mg every morning  5. Wellbutrin 150 mg XL taking 3 tablets total 450 mg XL every morning  6. Vraylar 4.5 mg every morning   98/61/85  Discussed seeing PCP for labs - TSH, B12, and Vitamin D.   Discussed Nuvigil for OSA.  RTC 6 months  Patient advised to contact office with any questions, adverse effects, or acute worsening in signs and symptoms.  Discussed potential benefits, risk, and side effects of benzodiazepines to include potential risk of tolerance and dependence, as well as possible drowsiness.  Advised patient not to drive if experiencing drowsiness and to take lowest possible effective dose to minimize risk of dependence and tolerance.   Discussed potential benefits, risks, and side effects of stimulants with patient to include increased heart rate, palpitations, insomnia, increased anxiety, increased irritability, or decreased appetite.  Instructed patient to contact office if experiencing any significant tolerability issues.   Discussed potential metabolic side effects associated with atypical antipsychotics, as well as potential risk for movement side effects. Advised pt to contact office if movement side effects occur.    Counseled patient regarding potential benefits, risks, and side effects of Lamictal to include potential risk of Stevens-Johnson syndrome. Advised patient to stop taking Lamictal and contact office immediately if rash develops and to seek urgent medical attention if rash is severe and/or spreading quickly.    Diagnoses and all orders for this visit:  OSA (obstructive sleep apnea)  Bipolar I disorder, moderate, current or most recent episode depressed, with anxious distress (HCC) -     methylphenidate (RITALIN) 10 MG tablet; Take 1 tablet (10 mg total) by mouth 2 (two) times  daily with breakfast and lunch. -     methylphenidate (RITALIN) 10 MG tablet; Take 1 tablet (10 mg total) by mouth 2 (two) times daily with breakfast and lunch. -     methylphenidate (  RITALIN) 10 MG tablet; Take 1 tablet (10 mg total) by mouth 2 (two) times daily with breakfast and lunch.  Secondary neurodevelopmental disorder -     methylphenidate (RITALIN) 10 MG tablet; Take 1 tablet (10 mg total) by mouth 2 (two) times daily with breakfast and lunch. -     methylphenidate (RITALIN) 10 MG tablet; Take 1 tablet (10 mg total) by mouth 2 (two) times daily with breakfast and lunch. -     methylphenidate (RITALIN) 10 MG tablet; Take 1 tablet (10 mg total) by mouth 2 (two) times daily with breakfast and lunch.  Generalized anxiety disorder   Please see After Visit Summary for patient specific instructions.  Future Appointments  Date Time Provider Department Center  01/10/2022  4:20 PM Mirinda Monte, Thereasa Solo, NP CP-CP None    No orders of the defined types were placed in this encounter.     -------------------------------

## 2021-08-30 ENCOUNTER — Emergency Department (HOSPITAL_BASED_OUTPATIENT_CLINIC_OR_DEPARTMENT_OTHER)
Admission: EM | Admit: 2021-08-30 | Discharge: 2021-08-30 | Disposition: A | Discharge status: Home / Self Care | Payer: No Typology Code available for payment source | Attending: Student | Admitting: Student

## 2021-08-30 ENCOUNTER — Encounter (HOSPITAL_BASED_OUTPATIENT_CLINIC_OR_DEPARTMENT_OTHER): Payer: Self-pay

## 2021-08-30 ENCOUNTER — Emergency Department (HOSPITAL_BASED_OUTPATIENT_CLINIC_OR_DEPARTMENT_OTHER): Payer: No Typology Code available for payment source

## 2021-08-30 ENCOUNTER — Other Ambulatory Visit: Payer: Self-pay

## 2021-08-30 DIAGNOSIS — R197 Diarrhea, unspecified: Secondary | ICD-10-CM | POA: Diagnosis present

## 2021-08-30 DIAGNOSIS — Z7982 Long term (current) use of aspirin: Secondary | ICD-10-CM | POA: Insufficient documentation

## 2021-08-30 DIAGNOSIS — U071 COVID-19: Secondary | ICD-10-CM | POA: Diagnosis not present

## 2021-08-30 DIAGNOSIS — R109 Unspecified abdominal pain: Secondary | ICD-10-CM

## 2021-08-30 LAB — COMPREHENSIVE METABOLIC PANEL
ALT: 15 U/L (ref 0–44)
AST: 21 U/L (ref 15–41)
Albumin: 3.7 g/dL (ref 3.5–5.0)
Alkaline Phosphatase: 85 U/L (ref 38–126)
Anion gap: 8 (ref 5–15)
BUN: 7 mg/dL (ref 6–20)
CO2: 25 mmol/L (ref 22–32)
Calcium: 8.8 mg/dL — ABNORMAL LOW (ref 8.9–10.3)
Chloride: 104 mmol/L (ref 98–111)
Creatinine, Ser: 0.8 mg/dL (ref 0.44–1.00)
GFR, Estimated: >60 mL/min (ref >60)
Glucose, Bld: 103 mg/dL — ABNORMAL HIGH (ref 70–99)
Potassium: 4.4 mmol/L (ref 3.5–5.1)
Sodium: 137 mmol/L (ref 135–145)
Total Bilirubin: 1 mg/dL (ref 0.3–1.2)
Total Protein: 6.7 g/dL (ref 6.5–8.1)

## 2021-08-30 LAB — CBC WITH DIFFERENTIAL/PLATELET
Abs Immature Granulocytes: 0.03 10*3/uL (ref 0.00–0.07)
Basophils Absolute: 0 10*3/uL (ref 0.0–0.1)
Basophils Relative: 0 %
Eosinophils Absolute: 0.2 10*3/uL (ref 0.0–0.5)
Eosinophils Relative: 2 %
HCT: 38.7 % (ref 36.0–46.0)
Hemoglobin: 13.1 g/dL (ref 12.0–15.0)
Immature Granulocytes: 0 %
Lymphocytes Relative: 24 %
Lymphs Abs: 2.2 10*3/uL (ref 0.7–4.0)
MCH: 29.9 pg (ref 26.0–34.0)
MCHC: 33.9 g/dL (ref 30.0–36.0)
MCV: 88.4 fL (ref 80.0–100.0)
Monocytes Absolute: 0.5 10*3/uL (ref 0.1–1.0)
Monocytes Relative: 6 %
Neutro Abs: 6.1 10*3/uL (ref 1.7–7.7)
Neutrophils Relative %: 68 %
Platelets: 265 10*3/uL (ref 150–400)
RBC: 4.38 MIL/uL (ref 3.87–5.11)
RDW: 12.9 % (ref 11.5–15.5)
WBC: 9 10*3/uL (ref 4.0–10.5)
nRBC: 0 % (ref 0.0–0.2)

## 2021-08-30 LAB — LIPASE, BLOOD: Lipase: 28 U/L (ref 11–51)

## 2021-08-30 LAB — RESP PANEL BY RT-PCR (FLU A&B, COVID) ARPGX2
Influenza A by PCR: NEGATIVE
Influenza B by PCR: NEGATIVE
SARS Coronavirus 2 by RT PCR: POSITIVE — AB

## 2021-08-30 MED ORDER — ONDANSETRON HCL 4 MG/2ML IJ SOLN
4.0000 mg | Freq: Once | INTRAMUSCULAR | Status: AC
  Administered 2021-08-30: 4 mg via INTRAVENOUS
  Filled 2021-08-30: qty 2

## 2021-08-30 MED ORDER — LACTATED RINGERS IV BOLUS
1000.0000 mL | Freq: Once | INTRAVENOUS | Status: AC
  Administered 2021-08-30: 1000 mL via INTRAVENOUS

## 2021-08-30 MED ORDER — KETOROLAC TROMETHAMINE 15 MG/ML IJ SOLN
15.0000 mg | Freq: Once | INTRAMUSCULAR | Status: AC
  Administered 2021-08-30: 15 mg via INTRAVENOUS
  Filled 2021-08-30: qty 1

## 2021-08-30 NOTE — ED Notes (Signed)
Patient given discharge instructions, all questions answered. Patient in possession of all belongings, directed to the discharge area  

## 2021-08-30 NOTE — ED Triage Notes (Signed)
Pt arrives ambulatory to ED with c/o NVD since being diagnosed with COVID last Monday was placed on antibiotics and antinausea medication. Pt states that she is still coughing and weak and generally feels like she is not getting better.

## 2021-08-30 NOTE — Discharge Instructions (Signed)
You were seen in the emergency department for evaluation of diarrhea and generalized weakness.  Your laboratory evaluation today was reassuringly normal and your chest x-ray did not show pneumonia.  Thus, please discontinue the antibiotic as it is likely the source of your diarrhea.  You will be discharged with collection samples for your stool as we will need to determine whether or not you have an acquired C. difficile infection from the antibiotic or if this is infectious vs medication side effect.  We came up with a plan today that if your diarrhea and fatigue continue after discontinuing the antibiotics, you are welcome to return to the emergency department for CAT scan imaging.  If your symptoms improve, please follow with your primary doctor.  At this time you are safe for discharge, return the emergency department if you have persistent fevers, intractable vomiting or any other concerning symptoms.

## 2021-08-30 NOTE — ED Provider Notes (Signed)
[facility] EMERGENCY DEPARTMENT Provider Note   CSN: [id number] Arrival date & time: 08/30/21  2951     History Chief Complaint  Patient presents with   Abdominal Pain    Madison Henry is a 50 y.o. 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  Patient was diagnosed with COVID on 08/19/2021 and followed up with her primary care family nurse practitioner who told the patient that she had a "secondary infection" and that she had "a rattle in her chest", did not pursue chest x-ray imaging but instead placed the patient on empiric Augmentin and she has since developed copious diarrhea.  Diarrhea is nonbloody but she feels that she is unable to control her bowel movements and she is currently wearing a diaper.  She denies abdominal pain, chest pain, shortness of breath, headache, fever or any other systemic symptoms.  She states that she feels increasingly weak as every time that she drinks water she has copious diarrhea.  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     No family history on file.  Social History   Tobacco Use   Smoking status: Never   Smokeless tobacco: Never  Vaping Use   Vaping Use: Never used  Substance Use Topics   Alcohol use: Not Currently   Drug use: Never    Home Medications Prior to Admission medications   Medication Sig Start Date End Date Taking? Authorizing Provider  aspirin 81 MG chewable tablet Chew 1 tablet (81 mg total) by mouth daily. 10/22/20   Horton, Kristie M, DO  buPROPion (WELLBUTRIN XL) 150 MG 24 hr tablet Take 3 tablets (450 mg total) by mouth daily after breakfast. 12/19/20   Mozingo, Thereasa Solo, NP  Cariprazine HCl (VRAYLAR) 4.5 MG CAPS Take 1 capsule (4.5 mg total) by mouth daily after breakfast. 12/19/20   Mozingo, Thereasa Solo, NP  diazepam (VALIUM) 5 MG tablet Take 1 tablet (5 mg total) by mouth 2 (two) times daily as needed. 12/19/20   Mozingo, Thereasa Solo, NP  escitalopram (LEXAPRO) 20 MG tablet Take 1 tablet (20 mg total) by mouth daily after breakfast. 12/19/20   Mozingo, Thereasa Solo, NP  HYDROcodone-acetaminophen (NORCO) 5-325 MG tablet Take 1-2 tablets by mouth every 6 (six) hours as needed. 03/20/19   Geoffery Lyons, MD  HYDROmorphone (DILAUDID) 2 MG tablet Take 0.5 tablets (1 mg total) by mouth every 6 (six) hours as  needed for severe pain. 12/27/19   Long, Wonda Olds, MD  lamoTRIgine (LAMICTAL) 200 MG tablet Take 2 tablets (400 mg total) by mouth at bedtime. 12/19/20   Mozingo, Berdie Ogren, NP  methylphenidate (RITALIN) 10 MG tablet Take 1 tablet (10 mg total) by mouth 2 (two) times daily with breakfast and lunch. 05/10/21 06/09/21  Mozingo, Berdie Ogren, NP  methylphenidate (RITALIN) 10 MG tablet Take 1 tablet (10 mg total) by mouth 2 (two) times daily with breakfast and lunch. 05/10/21 06/09/21  Cottle, Billey Co., MD  methylphenidate (RITALIN) 10 MG tablet Take 1 tablet (10 mg  total) by mouth 2 (two) times daily with breakfast and lunch. 07/12/21 08/11/21  Mozingo, Berdie Ogren, NP  methylphenidate (RITALIN) 10 MG tablet Take 1 tablet (10 mg total) by mouth 2 (two) times daily with breakfast and lunch. 08/09/21 09/08/21  Mozingo, Berdie Ogren, NP  methylphenidate (RITALIN) 10 MG tablet Take 1 tablet (10 mg total) by mouth 2 (two) times daily with breakfast and lunch. 09/06/21 10/06/21  Mozingo, Berdie Ogren, NP  predniSONE (DELTASONE) 10 MG tablet Take 2 tablets (20 mg total) by mouth 2 (two) times daily. 03/20/19   Veryl Speak, MD    Allergies    Patient has no known allergies.  Review of Systems   Review of Systems  Constitutional:  Negative for chills and fever.  HENT:  Negative for ear pain and sore throat.   Eyes:  Negative for pain and visual disturbance.  Respiratory:  Negative for cough and shortness of breath.   Cardiovascular:  Negative for chest pain and palpitations.  Gastrointestinal:  Positive for abdominal pain and diarrhea. Negative for vomiting.  Genitourinary:  Negative for dysuria and hematuria.  Musculoskeletal:  Negative for arthralgias and back pain.  Skin:  Negative for color change and rash.  Neurological:  Negative for seizures and syncope.  All other systems reviewed and are negative.  Physical Exam Updated Vital Signs BP 113/78   Pulse 79   Temp 98.9 F (37.2 C) (Oral)   Resp 16   Ht 5\' 5"  (1.651 m)   Wt 105.7 kg   SpO2 99%   BMI 38.77 kg/m   Physical Exam Vitals and nursing note reviewed.  Constitutional:      General: She is not in acute distress.    Appearance: She is well-developed.  HENT:     Head: Normocephalic and atraumatic.  Eyes:     Conjunctiva/sclera: Conjunctivae normal.  Cardiovascular:     Rate and Rhythm: Normal rate and regular rhythm.     Heart sounds: No murmur heard. Pulmonary:     Effort: Pulmonary effort is normal. No respiratory distress.     Breath sounds: Normal breath sounds.   Abdominal:     Palpations: Abdomen is soft.     Tenderness: There is no abdominal tenderness.  Musculoskeletal:        General: No swelling.     Cervical back: Neck supple.  Skin:    General: Skin is warm and dry.     Capillary Refill: Capillary refill takes less than 2 seconds.  Neurological:     Mental Status: She is alert.  Psychiatric:        Mood and Affect: Mood normal.    ED Results / Procedures / Treatments   Labs (all labs ordered are listed, but only abnormal results are displayed) Labs Reviewed  RESP PANEL BY RT-PCR (FLU A&B, COVID) ARPGX2 - Abnormal; Notable for the following components:  Result Value   SARS Coronavirus 2 by RT PCR POSITIVE (*)    All other components within normal limits  COMPREHENSIVE METABOLIC PANEL - Abnormal; Notable for the following components:   Glucose, Bld 103 (*)    Calcium 8.8 (*)    All other components within normal limits  C DIFFICILE QUICK SCREEN W PCR REFLEX    GASTROINTESTINAL PANEL BY PCR, STOOL (REPLACES STOOL CULTURE)  CBC WITH DIFFERENTIAL/PLATELET  LIPASE, BLOOD  URINALYSIS, ROUTINE W REFLEX MICROSCOPIC    EKG None  Radiology DG Chest Port 1 View  Result Date: 08/30/2021 CLINICAL DATA:  Coughing EXAM: PORTABLE CHEST 1 VIEW COMPARISON:  None. FINDINGS: Heart size and mediastinal contours are within normal limits. No suspicious pulmonary opacities identified. No pleural effusion or pneumothorax visualized. No acute osseous abnormality appreciated. IMPRESSION: No acute intrathoracic process identified. Electronically Signed   By: Ofilia Neas M.D.   On: 08/30/2021 13:24    Procedures Procedures   Medications Ordered in ED Medications  lactated ringers bolus 1,000 mL (0 mLs Intravenous Stopped 08/30/21 1239)  ondansetron (ZOFRAN) injection 4 mg (4 mg Intravenous Given 08/30/21 1139)  ketorolac (TORADOL) 15 MG/ML injection 15 mg (15 mg Intravenous Given 08/30/21 1400)    ED Course  I have reviewed the triage  vital signs and the nursing notes.  Pertinent labs & imaging results that were available during my care of the patient were reviewed by me and considered in my medical decision making (see chart for details).    MDM Rules/Calculators/A&P                           Patient seen emergency department for evaluation of generalized malaise and fatigue as well as diarrhea in the setting of a known COVID-19 infection.  Physical exam is unremarkable with a benign abdomen, no appreciable abdominal tenderness to palpation, cardiopulmonary exam unremarkable.  Laboratory evaluation unremarkable with unremarkable CMP, LFTs normal, CBC unremarkable, lipase unremarkable, COVID and flu testing revealing persistent positive COVID test but this is likely carryover from previous infection.  Chest x-ray obtained that shows no concomitant pneumonia.  Suspect patient likely suffering from medication side effect of the Augmentin with persistent diarrhea.  C. difficile is possible but would be abnormal so rapidly after starting Augmentin and that we will attempt to obtain stool studies that she will bring to her primary care physician.  Using shared decision-making, we decided against CAT scan imaging in the emergency department as she has no abdominal pain and instead she will trial stopping the Augmentin and monitoring for improvement of her diarrhea.  She will follow-up with her primary care physician if symptoms not improved and she is always welcome to return to the emergency department for further evaluation at which she voiced understanding.  She was given return precautions of which she also voiced understanding and was ultimately discharged with primary care follow-up. Final Clinical Impression(s) / ED Diagnoses Final diagnoses:  Diarrhea, unspecified type  COVID-19    Rx / DC Orders ED Discharge Orders     None        Oseph Imburgia, Debe Coder, MD 08/30/21 1512

## 2021-10-15 ENCOUNTER — Other Ambulatory Visit: Payer: Self-pay

## 2021-10-15 ENCOUNTER — Telehealth: Payer: Self-pay | Admitting: Adult Health

## 2021-10-15 DIAGNOSIS — F3132 Bipolar disorder, current episode depressed, moderate: Secondary | ICD-10-CM

## 2021-10-15 DIAGNOSIS — F88 Other disorders of psychological development: Secondary | ICD-10-CM

## 2021-10-15 MED ORDER — METHYLPHENIDATE HCL 10 MG PO TABS: 10.0000 mg | ORAL_TABLET | Freq: Two times a day (BID) | ORAL | 0 refills | Status: DC

## 2021-10-15 NOTE — Telephone Encounter (Signed)
Pended.

## 2021-10-15 NOTE — Telephone Encounter (Signed)
Patient lvm stating that she was returning a call regarding her medication. She needs her Ritalin 10mg  filled and sent to [facility] [address] [place]. Ph: [phone]. Appt 4/21

## 2021-11-21 ENCOUNTER — Other Ambulatory Visit: Payer: Self-pay | Admitting: Adult Health

## 2021-11-21 DIAGNOSIS — F3132 Bipolar disorder, current episode depressed, moderate: Secondary | ICD-10-CM

## 2021-11-21 DIAGNOSIS — F88 Other disorders of psychological development: Secondary | ICD-10-CM

## 2021-12-31 ENCOUNTER — Telehealth: Payer: Self-pay

## 2021-12-31 DIAGNOSIS — F88 Other disorders of psychological development: Secondary | ICD-10-CM

## 2021-12-31 DIAGNOSIS — F3132 Bipolar disorder, current episode depressed, moderate: Secondary | ICD-10-CM

## 2021-12-31 MED ORDER — VRAYLAR 4.5 MG PO CAPS: 4.5000 mg | ORAL_CAPSULE | Freq: Every day | ORAL | 2 refills | Status: DC

## 2021-12-31 NOTE — Telephone Encounter (Signed)
Prior Authorization submitted and approved for VRAYLAR 4.5 MG only #30/30 Day, with [employer] effective 12/31/2021-12/31/2022 ID# [id number] ? ? ?Will update Rx to 30 day supply ?

## 2022-01-01 NOTE — Telephone Encounter (Signed)
Noted. Ty!

## 2022-01-10 ENCOUNTER — Telehealth (INDEPENDENT_AMBULATORY_CARE_PROVIDER_SITE_OTHER): Payer: No Typology Code available for payment source | Admitting: Adult Health

## 2022-01-10 ENCOUNTER — Encounter: Payer: Self-pay | Admitting: Adult Health

## 2022-01-10 DIAGNOSIS — F88 Other disorders of psychological development: Secondary | ICD-10-CM | POA: Diagnosis not present

## 2022-01-10 DIAGNOSIS — G4733 Obstructive sleep apnea (adult) (pediatric): Secondary | ICD-10-CM | POA: Diagnosis not present

## 2022-01-10 DIAGNOSIS — F3132 Bipolar disorder, current episode depressed, moderate: Secondary | ICD-10-CM

## 2022-01-10 DIAGNOSIS — F411 Generalized anxiety disorder: Secondary | ICD-10-CM

## 2022-01-10 MED ORDER — METHYLPHENIDATE HCL 10 MG PO TABS: 10.0000 mg | ORAL_TABLET | Freq: Two times a day (BID) | ORAL | 0 refills | Status: DC

## 2022-01-10 MED ORDER — VRAYLAR 4.5 MG PO CAPS: 4.5000 mg | ORAL_CAPSULE | Freq: Every day | ORAL | 2 refills | Status: DC

## 2022-01-10 MED ORDER — DIAZEPAM 5 MG PO TABS: 5.0000 mg | ORAL_TABLET | Freq: Two times a day (BID) | ORAL | 2 refills | Status: DC | PRN

## 2022-01-10 MED ORDER — LAMOTRIGINE 200 MG PO TABS: 400.0000 mg | ORAL_TABLET | Freq: Every day | ORAL | 3 refills | Status: DC

## 2022-01-10 MED ORDER — ESCITALOPRAM OXALATE 20 MG PO TABS: 20.0000 mg | ORAL_TABLET | Freq: Every day | ORAL | 3 refills | Status: DC

## 2022-01-10 MED ORDER — BUPROPION HCL ER (XL) 150 MG PO TB24: 450.0000 mg | ORAL_TABLET | Freq: Every day | ORAL | 3 refills | Status: DC

## 2022-01-10 NOTE — Progress Notes (Signed)
Jasalyn Spurgeon-Yocum ?[id number] ?[birth year] ?51 y.o. ? ?Virtual Visit via Video Note ? ?I connected with pt @ on 01/10/22 at  4:20 PM EDT by a video enabled telemedicine application and verified that I am speaking with the correct person using two identifiers. ?  ?I discussed the limitations of evaluation and management by telemedicine and the availability of in person appointments. The patient expressed understanding and agreed to proceed. ? ?I discussed the assessment and treatment plan with the patient. The patient was provided an opportunity to ask questions and all were answered. The patient agreed with the plan and demonstrated an understanding of the instructions. ?  ?The patient was advised to call back or seek an in-person evaluation if the symptoms worsen or if the condition fails to improve as anticipated. ? ?I provided 25 minutes of non-face-to-face time during this encounter.  The patient was located at home.  The provider was located at [facility]. ? ? ?Dorothyann Gibbs, NP  ? ?Subjective:  ? ?Patient ID:  Emmeline Spurgeon-Yocum is a 51 y.o. (DOB 1970/12/16) female. ? ?Chief Complaint: No chief complaint on file. ? ? ?HPI ?Gailene Spurgeon-Yocum presents for follow-up of BPD-1, OSA, GAD, secondary  ? ?Describes mood today as "ok". Pleasant. Mood symptoms - reports some depression and anxiety. Denies irritability. ?Stating "I'm doing as good as I can with everything that's gone on". Increased medical issues - stroke over past 6 months. Stable interest and motivation. Taking medications as prescribed. Stable interest and motivation. Taking medications as prescribed.  ?Energy levels stable. Active, does not have a regular exercise routine.  ?Enjoys some usual interests and activities. Married. Lives with husband. Has 3 children 26, 18, and 14. Spending time with family.  ?Appetite adequate. Weight loss - 235 pounds. ?Sleeps well most nights. Averages 8 hours - has sleep apnea - unable to tolerate  CPAP. ?Focus and concentration stable. Completing tasks. Managing aspects of household. 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 ?Denies SI or HI.  ?Denies AH or VH. ? ?Previous medication trials: Denies ? ? ?Review of Systems:  ?Review of Systems  ?Musculoskeletal:  Negative for gait problem.  ?Neurological:  Negative for tremors.  ?Psychiatric/Behavioral:    ?     Please refer to HPI  ? ?Medications: I have reviewed the patient's current medications. ? ?Current Outpatient Medications  ?Medication Sig Dispense Refill  ? aspirin 81 MG chewable tablet Chew 1 tablet (81 mg total) by mouth daily. 30 tablet 0  ? buPROPion (WELLBUTRIN XL) 150 MG 24 hr tablet Take 3 tablets (450 mg total) by mouth daily after breakfast. 270 tablet 3  ? Cariprazine HCl (VRAYLAR) 4.5 MG CAPS Take 1 capsule (4.5 mg total) by mouth daily after breakfast. 30 capsule 2  ? diazepam (VALIUM) 5 MG tablet Take 1 tablet (5 mg total) by mouth 2 (two) times daily as needed. 30 tablet 2  ? escitalopram (LEXAPRO) 20 MG tablet Take 1 tablet (20 mg total) by mouth daily after breakfast. 90 tablet 3  ? HYDROcodone-acetaminophen (NORCO) 5-325 MG tablet Take 1-2 tablets by mouth every 6 (six) hours as needed. 15 tablet 0  ? HYDROmorphone (DILAUDID) 2 MG tablet Take 0.5 tablets (1 mg total) by mouth every 6 (six) hours as needed for severe pain. 10 tablet 0  ? lamoTRIgine (LAMICTAL) 200 MG tablet Take 2 tablets (400 mg total) by mouth at bedtime. 180 tablet 3  ? methylphenidate (RITALIN) 10 MG tablet Take 1  tablet (10 mg total) by mouth 2 (two) times daily with breakfast and lunch. 60 tablet 0  ? [START ON 02/07/2022] methylphenidate (RITALIN) 10 MG tablet Take 1 tablet (10 mg total) by mouth 2 (two) times daily with breakfast and lunch. 60 tablet 0  ? [START ON 03/07/2022] methylphenidate (RITALIN) 10 MG tablet Take 1 tablet (10 mg total) by mouth 2 (two) times daily with breakfast and lunch. 60 tablet 0  ? predniSONE (DELTASONE) 10  MG tablet Take 2 tablets (20 mg total) by mouth 2 (two) times daily. 20 tablet 0  ? ?No current facility-administered medications for this visit.  ? ? ?Medication Side Effects: None ? ?Allergies: No Known Allergies ? ?Past Medical History:  ?Diagnosis Date  ? Factor 5 Leiden mutation, heterozygous (HCC)   ? Stroke [facility](HCC)   ? ? ?No family history on file. ? ?Social History  ? ?Socioeconomic History  ? Marital status: Married  ?  Spouse name: Not on file  ? Number of children: Not on file  ? Years of education: Not on file  ? Highest education level: Not on file  ?Occupational History  ? Not on file  ?Tobacco Use  ? Smoking status: Never  ? Smokeless tobacco: Never  ?Vaping Use  ? Vaping Use: Never used  ?Substance and Sexual Activity  ? Alcohol use: Not Currently  ? Drug use: Never  ? Sexual activity: Not on file  ?Other Topics Concern  ? Not on file  ?Social History Narrative  ? Not on file  ? ?Social Determinants of Health  ? ?Financial Resource Strain: Not on file  ?Food Insecurity: Not on file  ?Transportation Needs: Not on file  ?Physical Activity: Not on file  ?Stress: Not on file  ?Social Connections: Not on file  ?Intimate Partner Violence: Not on file  ? ? ?Past Medical History, Surgical history, Social history, and Family history were reviewed and updated as appropriate.  ? ?Please see review of systems for further details on the patient's review from today.  ? ?Objective:  ? ?Physical Exam:  ?There were no vitals taken for this visit. ? ?Physical Exam ?Constitutional:   ?   General: She is not in acute distress. ?Musculoskeletal:     ?   General: No deformity.  ?Neurological:  ?   Mental Status: She is alert and oriented to person, place, and time.  ?   Coordination: Coordination normal.  ?Psychiatric:     ?   Attention and Perception: Attention and perception normal. She does not perceive auditory or visual hallucinations.     ?   Mood and Affect: Mood normal. Mood is not anxious or depressed. Affect is  not labile, blunt, angry or inappropriate.     ?   Speech: Speech normal.     ?   Behavior: Behavior normal.     ?   Thought Content: Thought content normal. Thought content is not paranoid or delusional. Thought content does not include homicidal or suicidal ideation. Thought content does not include homicidal or suicidal plan.     ?   Cognition and Memory: Cognition and memory normal.     ?   Judgment: Judgment normal.  ?   Comments: Insight intact  ? ? ?Lab Review:  ?   ?Component Value Date/Time  ? NA 137 08/30/2021 1128  ? K 4.4 08/30/2021 1128  ? CL 104 08/30/2021 1128  ? CO2 25 08/30/2021 1128  ? GLUCOSE 103 (H) 08/30/2021 1128  ?  BUN 7 08/30/2021 1128  ? CREATININE 0.80 08/30/2021 1128  ? CALCIUM 8.8 (L) 08/30/2021 1128  ? PROT 6.7 08/30/2021 1128  ? ALBUMIN 3.7 08/30/2021 1128  ? AST 21 08/30/2021 1128  ? ALT 15 08/30/2021 1128  ? ALKPHOS 85 08/30/2021 1128  ? BILITOT 1.0 08/30/2021 1128  ? GFRNONAA >60 08/30/2021 1128  ? GFRAA >60 12/27/2019 2003  ? ? ?   ?Component Value Date/Time  ? WBC 9.0 08/30/2021 1128  ? RBC 4.38 08/30/2021 1128  ? HGB 13.1 08/30/2021 1128  ? HCT 38.7 08/30/2021 1128  ? PLT 265 08/30/2021 1128  ? MCV 88.4 08/30/2021 1128  ? MCH 29.9 08/30/2021 1128  ? MCHC 33.9 08/30/2021 1128  ? RDW 12.9 08/30/2021 1128  ? LYMPHSABS 2.2 08/30/2021 1128  ? MONOABS 0.5 08/30/2021 1128  ? EOSABS 0.2 08/30/2021 1128  ? BASOSABS 0.0 08/30/2021 1128  ? ? ?No results found for: POCLITH, LITHIUM  ? ?No results found for: PHENYTOIN, PHENOBARB, VALPROATE, CBMZ  ? ?.res ?Assessment: Plan:   ? ?Plan: ? ?PDMP reviewed ? ?1. Valium 5 mg twice daily as needed  ?2. Ritalin 10 mg IR twice daily ?3  Lamictal 200 mg every bedtime  ?4. Lexapro 20 mg every morning  ?5. Wellbutrin 150 mg XL taking 3 tablets total 450 mg XL every morning  ?6. Vraylar 4.5 mg every morning  ? ?98/61/85 ? ?Discussed seeing PCP for labs - TSH, B12, and Vitamin D.  ? ?Discussed Nuvigil for OSA. ? ?RTC 6 months ? ?Patient advised to contact  office with any questions, adverse effects, or acute worsening in signs and symptoms. ? ?Discussed potential benefits, risk, and side effects of benzodiazepines to include potential risk of tolerance and d

## 2022-04-10 ENCOUNTER — Other Ambulatory Visit: Payer: Self-pay | Admitting: Adult Health

## 2022-04-10 DIAGNOSIS — F88 Other disorders of psychological development: Secondary | ICD-10-CM

## 2022-04-10 DIAGNOSIS — F3132 Bipolar disorder, current episode depressed, moderate: Secondary | ICD-10-CM

## 2022-06-10 ENCOUNTER — Other Ambulatory Visit: Payer: Self-pay | Admitting: Adult Health

## 2022-06-10 DIAGNOSIS — F3132 Bipolar disorder, current episode depressed, moderate: Secondary | ICD-10-CM

## 2022-06-10 DIAGNOSIS — F88 Other disorders of psychological development: Secondary | ICD-10-CM

## 2022-07-24 ENCOUNTER — Telehealth: Payer: Self-pay | Admitting: Adult Health

## 2022-07-24 ENCOUNTER — Other Ambulatory Visit: Payer: Self-pay

## 2022-07-24 DIAGNOSIS — F88 Other disorders of psychological development: Secondary | ICD-10-CM

## 2022-07-24 DIAGNOSIS — F3132 Bipolar disorder, current episode depressed, moderate: Secondary | ICD-10-CM

## 2022-07-24 MED ORDER — METHYLPHENIDATE HCL 10 MG PO TABS: 10.0000 mg | ORAL_TABLET | Freq: Two times a day (BID) | ORAL | 0 refills | Status: DC

## 2022-07-24 NOTE — Telephone Encounter (Signed)
Pt called to schedule follow up 11/14 apt and [facility] [address]

## 2022-07-24 NOTE — Telephone Encounter (Signed)
Pended.

## 2022-08-05 ENCOUNTER — Encounter: Payer: Self-pay | Admitting: Adult Health

## 2022-08-05 ENCOUNTER — Telehealth (INDEPENDENT_AMBULATORY_CARE_PROVIDER_SITE_OTHER): Payer: No Typology Code available for payment source | Admitting: Adult Health

## 2022-08-05 DIAGNOSIS — F411 Generalized anxiety disorder: Secondary | ICD-10-CM

## 2022-08-05 DIAGNOSIS — F3132 Bipolar disorder, current episode depressed, moderate: Secondary | ICD-10-CM

## 2022-08-05 DIAGNOSIS — F88 Other disorders of psychological development: Secondary | ICD-10-CM | POA: Diagnosis not present

## 2022-08-05 DIAGNOSIS — G4733 Obstructive sleep apnea (adult) (pediatric): Secondary | ICD-10-CM

## 2022-08-05 MED ORDER — METHYLPHENIDATE HCL 10 MG PO TABS: 10.0000 mg | ORAL_TABLET | Freq: Two times a day (BID) | ORAL | 0 refills | Status: DC

## 2022-08-05 MED ORDER — VRAYLAR 4.5 MG PO CAPS: 4.5000 mg | ORAL_CAPSULE | Freq: Every day | ORAL | 3 refills | Status: DC

## 2022-08-05 MED ORDER — BUPROPION HCL ER (XL) 150 MG PO TB24: 450.0000 mg | ORAL_TABLET | Freq: Every day | ORAL | 3 refills | Status: DC

## 2022-08-05 MED ORDER — ESCITALOPRAM OXALATE 20 MG PO TABS: 20.0000 mg | ORAL_TABLET | Freq: Every day | ORAL | 3 refills | Status: DC

## 2022-08-05 MED ORDER — DIAZEPAM 5 MG PO TABS: 5.0000 mg | ORAL_TABLET | Freq: Two times a day (BID) | ORAL | 2 refills | Status: DC | PRN

## 2022-08-05 MED ORDER — LAMOTRIGINE 200 MG PO TABS: 400.0000 mg | ORAL_TABLET | Freq: Every day | ORAL | 3 refills | Status: DC

## 2022-08-05 NOTE — Progress Notes (Signed)
Madison Henry [id number] 05-Dec-1970 51 y.o.  Virtual Visit via Video Note  I connected with pt @ on 08/05/22 at  5:00 PM EST by a video enabled telemedicine application and verified that I am speaking with the correct person using two identifiers.   I discussed the limitations of evaluation and management by telemedicine and the availability of in person appointments. The patient expressed understanding and agreed to proceed.  I discussed the assessment and treatment plan with the patient. The patient was provided an opportunity to ask questions and all were answered. The patient agreed with the plan and demonstrated an understanding of the instructions.   The patient was advised to call back or seek an in-person evaluation if the symptoms worsen or if the condition fails to improve as anticipated.  I provided 25 minutes of non-face-to-face time during this encounter.  The patient was located at home.  The provider was located at [facility].   Dorothyann Gibbs, NP   Subjective:   Patient ID:  Madison Henry is a 51 y.o. (DOB 1970-11-27) female.  Chief Complaint: No chief complaint on file.   HPI Madison Henry presents for follow-up of BPD-1, OSA, GAD, secondary neurodevelopmental disorder.  Describes mood today as "ok". Pleasant. Denies tearfulness. Mood symptoms - denies depression, anxiety. and irritability. Mood is consistent. Stating "I'm doing alright". Feels like medications are working well. Recovering from recent illness. Family doing well. Stable interest and motivation. Taking medications as prescribed.  Energy levels lower. Active, does not have a regular exercise routine. Plans to restart P/T. Enjoys some usual interests and activities. Married. Lives with husband. Has 3 children 26, 18, and 14. Spending time with family.  Appetite adequate. Weight stable - 235 pounds. Sleeps well most nights. Averages 8 to 9 hours - has sleep apnea - unable  to tolerate CPAP. Focus and concentration stable. Completing tasks. Managing aspects of household. 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 Denies SI or HI.  Denies AH or VH.  Previous medication trials: Denies   Review of Systems:  Review of Systems  Musculoskeletal:  Negative for gait problem.  Neurological:  Negative for tremors.  Psychiatric/Behavioral:         Please refer to HPI    Medications: I have reviewed the patient's current medications.  Current Outpatient Medications  Medication Sig Dispense Refill   aspirin 81 MG chewable tablet Chew 1 tablet (81 mg total) by mouth daily. 30 tablet 0   buPROPion (WELLBUTRIN XL) 150 MG 24 hr tablet Take 3 tablets (450 mg total) by mouth daily after breakfast. 270 tablet 3   Cariprazine HCl (VRAYLAR) 4.5 MG CAPS Take 1 capsule (4.5 mg total) by mouth daily after breakfast. 90 capsule 3   diazepam (VALIUM) 5 MG tablet Take 1 tablet (5 mg total) by mouth 2 (two) times daily as needed. 30 tablet 2   escitalopram (LEXAPRO) 20 MG tablet Take 1 tablet (20 mg total) by mouth daily after breakfast. 90 tablet 3   HYDROcodone-acetaminophen (NORCO) 5-325 MG tablet Take 1-2 tablets by mouth every 6 (six) hours as needed. 15 tablet 0   HYDROmorphone (DILAUDID) 2 MG tablet Take 0.5 tablets (1 mg total) by mouth every 6 (six) hours as needed for severe pain. 10 tablet 0   lamoTRIgine (LAMICTAL) 200 MG tablet Take 2 tablets (400 mg total) by mouth at bedtime. 180 tablet 3   methylphenidate (RITALIN) 10 MG tablet Take 1 tablet (10  mg total) by mouth 2 (two) times daily with breakfast and lunch. 60 tablet 0   [START ON 09/02/2022] methylphenidate (RITALIN) 10 MG tablet Take 1 tablet (10 mg total) by mouth 2 (two) times daily with breakfast and lunch. 60 tablet 0   [START ON 09/30/2022] methylphenidate (RITALIN) 10 MG tablet Take 1 tablet (10 mg total) by mouth 2 (two) times daily with breakfast and lunch. 60 tablet 0   predniSONE  (DELTASONE) 10 MG tablet Take 2 tablets (20 mg total) by mouth 2 (two) times daily. 20 tablet 0   No current facility-administered medications for this visit.    Medication Side Effects: None  Allergies: No Known Allergies  Past Medical History:  Diagnosis Date   Factor 5 Leiden mutation, heterozygous (HCC)    Stroke (HCC)     No family history on file.  Social History   Socioeconomic History   Marital status: Married    Spouse name: Not on file   Number of children: Not on file   Years of education: Not on file   Highest education level: Not on file  Occupational History   Not on file  Tobacco Use   Smoking status: Never   Smokeless tobacco: Never  Vaping Use   Vaping Use: Never used  Substance and Sexual Activity   Alcohol use: Not Currently   Drug use: Never   Sexual activity: Not on file  Other Topics Concern   Not on file  Social History Narrative   Not on file   Social Determinants of Health   Financial Resource Strain: Not on file  Food Insecurity: Not on file  Transportation Needs: Not on file  Physical Activity: Not on file  Stress: Not on file  Social Connections: Not on file  Intimate Partner Violence: Not on file    Past Medical History, Surgical history, Social history, and Family history were reviewed and updated as appropriate.   Please see review of systems for further details on the patient's review from today.   Objective:   Physical Exam:  There were no vitals taken for this visit.  Physical Exam Constitutional:      General: She is not in acute distress. Musculoskeletal:        General: No deformity.  Neurological:     Mental Status: She is alert and oriented to person, place, and time.     Coordination: Coordination normal.  Psychiatric:        Attention and Perception: Attention and perception normal. She does not perceive auditory or visual hallucinations.        Mood and Affect: Mood normal. Mood is not anxious or  depressed. Affect is not labile, blunt, angry or inappropriate.        Speech: Speech normal.        Behavior: Behavior normal.        Thought Content: Thought content normal. Thought content is not paranoid or delusional. Thought content does not include homicidal or suicidal ideation. Thought content does not include homicidal or suicidal plan.        Cognition and Memory: Cognition and memory normal.        Judgment: Judgment normal.     Comments: Insight intact     Lab Review:     Component Value Date/Time   NA 137 08/30/2021 1128   K 4.4 08/30/2021 1128   CL 104 08/30/2021 1128   CO2 25 08/30/2021 1128   GLUCOSE 103 (H) 08/30/2021 1128   BUN  7 08/30/2021 1128   CREATININE 0.80 08/30/2021 1128   CALCIUM 8.8 (L) 08/30/2021 1128   PROT 6.7 08/30/2021 1128   ALBUMIN 3.7 08/30/2021 1128   AST 21 08/30/2021 1128   ALT 15 08/30/2021 1128   ALKPHOS 85 08/30/2021 1128   BILITOT 1.0 08/30/2021 1128   GFRNONAA >60 08/30/2021 1128   GFRAA >60 12/27/2019 2003       Component Value Date/Time   WBC 9.0 08/30/2021 1128   RBC 4.38 08/30/2021 1128   HGB 13.1 08/30/2021 1128   HCT 38.7 08/30/2021 1128   PLT 265 08/30/2021 1128   MCV 88.4 08/30/2021 1128   MCH 29.9 08/30/2021 1128   MCHC 33.9 08/30/2021 1128   RDW 12.9 08/30/2021 1128   LYMPHSABS 2.2 08/30/2021 1128   MONOABS 0.5 08/30/2021 1128   EOSABS 0.2 08/30/2021 1128   BASOSABS 0.0 08/30/2021 1128    No results found for: "POCLITH", "LITHIUM"   No results found for: "PHENYTOIN", "PHENOBARB", "VALPROATE", "CBMZ"   .res Assessment: Plan:     Plan:  PDMP reviewed  1. Valium 5 mg twice daily as needed  2. Ritalin 10 mg IR twice daily 3  Lamictal 200 mg every bedtime  4. Lexapro 20 mg every morning  5. Wellbutrin 150 mg XL taking 3 tablets total 450 mg XL every morning  6. Vraylar 4.5 mg every morning   98/61/85  Discussed seeing PCP for labs - TSH, B12, and Vitamin D.   Discussed Nuvigil for OSA.  RTC 6  months  Patient advised to contact office with any questions, adverse effects, or acute worsening in signs and symptoms.  Discussed potential benefits, risk, and side effects of benzodiazepines to include potential risk of tolerance and dependence, as well as possible drowsiness.  Advised patient not to drive if experiencing drowsiness and to take lowest possible effective dose to minimize risk of dependence and tolerance.   Discussed potential benefits, risks, and side effects of stimulants with patient to include increased heart rate, palpitations, insomnia, increased anxiety, increased irritability, or decreased appetite.  Instructed patient to contact office if experiencing any significant tolerability issues.   Discussed potential metabolic side effects associated with atypical antipsychotics, as well as potential risk for movement side effects. Advised pt to contact office if movement side effects occur.    Counseled patient regarding potential benefits, risks, and side effects of Lamictal to include potential risk of Stevens-Johnson syndrome. Advised patient to stop taking Lamictal and contact office immediately if rash develops and to seek urgent medical attention if rash is severe and/or spreading quickly.   Diagnoses and all orders for this visit:  Bipolar I disorder, moderate, current or most recent episode depressed, with anxious distress (HCC) -     Cariprazine HCl (VRAYLAR) 4.5 MG CAPS; Take 1 capsule (4.5 mg total) by mouth daily after breakfast. -     methylphenidate (RITALIN) 10 MG tablet; Take 1 tablet (10 mg total) by mouth 2 (two) times daily with breakfast and lunch. -     methylphenidate (RITALIN) 10 MG tablet; Take 1 tablet (10 mg total) by mouth 2 (two) times daily with breakfast and lunch. -     lamoTRIgine (LAMICTAL) 200 MG tablet; Take 2 tablets (400 mg total) by mouth at bedtime. -     escitalopram (LEXAPRO) 20 MG tablet; Take 1 tablet (20 mg total) by mouth daily after  breakfast. -     buPROPion (WELLBUTRIN XL) 150 MG 24 hr tablet; Take 3 tablets (450 mg  total) by mouth daily after breakfast. -     methylphenidate (RITALIN) 10 MG tablet; Take 1 tablet (10 mg total) by mouth 2 (two) times daily with breakfast and lunch.  Secondary neurodevelopmental disorder -     Cariprazine HCl (VRAYLAR) 4.5 MG CAPS; Take 1 capsule (4.5 mg total) by mouth daily after breakfast. -     methylphenidate (RITALIN) 10 MG tablet; Take 1 tablet (10 mg total) by mouth 2 (two) times daily with breakfast and lunch. -     methylphenidate (RITALIN) 10 MG tablet; Take 1 tablet (10 mg total) by mouth 2 (two) times daily with breakfast and lunch. -     lamoTRIgine (LAMICTAL) 200 MG tablet; Take 2 tablets (400 mg total) by mouth at bedtime. -     escitalopram (LEXAPRO) 20 MG tablet; Take 1 tablet (20 mg total) by mouth daily after breakfast. -     buPROPion (WELLBUTRIN XL) 150 MG 24 hr tablet; Take 3 tablets (450 mg total) by mouth daily after breakfast. -     methylphenidate (RITALIN) 10 MG tablet; Take 1 tablet (10 mg total) by mouth 2 (two) times daily with breakfast and lunch.  Generalized anxiety disorder -     lamoTRIgine (LAMICTAL) 200 MG tablet; Take 2 tablets (400 mg total) by mouth at bedtime. -     escitalopram (LEXAPRO) 20 MG tablet; Take 1 tablet (20 mg total) by mouth daily after breakfast. -     buPROPion (WELLBUTRIN XL) 150 MG 24 hr tablet; Take 3 tablets (450 mg total) by mouth daily after breakfast. -     diazepam (VALIUM) 5 MG tablet; Take 1 tablet (5 mg total) by mouth 2 (two) times daily as needed.  OSA (obstructive sleep apnea) -     buPROPion (WELLBUTRIN XL) 150 MG 24 hr tablet; Take 3 tablets (450 mg total) by mouth daily after breakfast.     Please see After Visit Summary for patient specific instructions.  No future appointments.   No orders of the defined types were placed in this encounter.     -------------------------------

## 2022-10-28 ENCOUNTER — Encounter: Payer: Self-pay | Admitting: Adult Health

## 2022-10-28 ENCOUNTER — Telehealth (INDEPENDENT_AMBULATORY_CARE_PROVIDER_SITE_OTHER): Payer: No Typology Code available for payment source | Admitting: Adult Health

## 2022-10-28 DIAGNOSIS — F88 Other disorders of psychological development: Secondary | ICD-10-CM

## 2022-10-28 DIAGNOSIS — F3132 Bipolar disorder, current episode depressed, moderate: Secondary | ICD-10-CM | POA: Diagnosis not present

## 2022-10-28 DIAGNOSIS — G4733 Obstructive sleep apnea (adult) (pediatric): Secondary | ICD-10-CM | POA: Diagnosis not present

## 2022-10-28 DIAGNOSIS — F411 Generalized anxiety disorder: Secondary | ICD-10-CM

## 2022-10-28 MED ORDER — ARMODAFINIL 50 MG PO TABS: 50.0000 mg | ORAL_TABLET | Freq: Every day | ORAL | 2 refills | Status: DC

## 2022-10-28 MED ORDER — DIAZEPAM 5 MG PO TABS: 5.0000 mg | ORAL_TABLET | Freq: Two times a day (BID) | ORAL | 2 refills | Status: DC | PRN

## 2022-10-28 MED ORDER — METHYLPHENIDATE HCL 10 MG PO TABS: 10.0000 mg | ORAL_TABLET | Freq: Two times a day (BID) | ORAL | 0 refills | Status: DC

## 2022-10-28 NOTE — Progress Notes (Addendum)
Madison Henry [id number] 05/20/1971 52 y.o.  Virtual Visit via Video Note  I connected with pt @ on 10/28/22 at  13:00 AM EST by a video enabled telemedicine application and verified that I am speaking with the correct person using two identifiers.   I discussed the limitations of evaluation and management by telemedicine and the availability of in person appointments. The patient expressed understanding and agreed to proceed.  I discussed the assessment and treatment plan with the patient. The patient was provided an opportunity to ask questions and all were answered. The patient agreed with the plan and demonstrated an understanding of the instructions.   The patient was advised to call back or seek an in-person evaluation if the symptoms worsen or if the condition fails to improve as anticipated.  I provided 25 minutes of non-face-to-face time during this encounter.  The patient was located at home.  The provider was located at [place].  Subjective:   Patient ID:  Madison Henry is a 52 y.o. (DOB Nov 07, 1970) female.  Chief Complaint: No chief complaint on file.   HPI Madison Henry presents to the office today for follow-up of BPD-1, OSA, GAD, secondary neurodevelopmental disorder.  Describes mood today as "ok". Pleasant. Denies tearfulness. Mood symptoms - reports depression, anxiety. and irritability. Feels like anxiety has worsened. Reports some worry, rumination, and over thinking. Mood is lower - worse over the past few months. Stating "I'm not in a good place - I would like to participate in life and not let it just pass me by". Lacking interest and motivation. Does not feel like medications are working for her. Stating "I'm just going through the motions". Family doing well. Stable interest and motivation. Taking medications as prescribed.  Energy levels lower. Active, does not have a regular exercise routine - working with P/T.  Enjoys some usual  interests and activities. Married. Lives with husband. Has 3 children 26, 18, and 14. Spending time with family.  Appetite adequate. Weight stable - 235 pounds. Reports waking up several times during the night. Averages 8 hours of broken sleep. Unable to tolerate CPAP. Focus and concentration stable. Completing tasks. Managing aspects of household. Works full-time as an Optometrist - planning to change jobs. Denies SI or HI.  Denies AH or VH. Denies self harm. Denies substance use.  Therapy on Wednesdays.  Previous medication trials: Denies   [place] [facility] from 08/30/2021 in [facility] ED from 10/22/2020 in [facility] at [place] No Risk Error: Question 6 not populated        Review of Systems:  Review of Systems  Musculoskeletal:  Negative for gait problem.  Neurological:  Negative for tremors.  Psychiatric/Behavioral:         Please refer to HPI    Medications: I have reviewed the patient's current medications.  Current Outpatient Medications  Medication Sig Dispense Refill   aspirin 81 MG chewable tablet Chew 1 tablet (81 mg total) by mouth daily. 30 tablet 0   buPROPion (WELLBUTRIN XL) 150 MG 24 hr tablet Take 3 tablets (450 mg total) by mouth daily after breakfast. 270 tablet 3   Cariprazine HCl (VRAYLAR) 4.5 MG CAPS Take 1 capsule (4.5 mg total) by mouth daily after breakfast. 90 capsule 3   diazepam (VALIUM) 5 MG tablet Take 1 tablet (5 mg total) by mouth 2 (two) times daily as needed. 30 tablet 2   escitalopram (LEXAPRO) 20  MG tablet Take 1 tablet (20 mg total) by mouth daily after breakfast. 90 tablet 3   HYDROcodone-acetaminophen (NORCO) 5-325 MG tablet Take 1-2 tablets by mouth every 6 (six) hours as needed. 15 tablet 0   HYDROmorphone (DILAUDID) 2 MG tablet Take 0.5 tablets (1 mg total) by mouth every 6 (six) hours as needed for severe pain. 10 tablet 0    lamoTRIgine (LAMICTAL) 200 MG tablet Take 2 tablets (400 mg total) by mouth at bedtime. 180 tablet 3   methylphenidate (RITALIN) 10 MG tablet Take 1 tablet (10 mg total) by mouth 2 (two) times daily with breakfast and lunch. 60 tablet 0   methylphenidate (RITALIN) 10 MG tablet Take 1 tablet (10 mg total) by mouth 2 (two) times daily with breakfast and lunch. 60 tablet 0   methylphenidate (RITALIN) 10 MG tablet Take 1 tablet (10 mg total) by mouth 2 (two) times daily with breakfast and lunch. 60 tablet 0   predniSONE (DELTASONE) 10 MG tablet Take 2 tablets (20 mg total) by mouth 2 (two) times daily. 20 tablet 0   No current facility-administered medications for this visit.    Medication Side Effects: None  Allergies: No Known Allergies  Past Medical History:  Diagnosis Date   Factor 5 Leiden mutation, heterozygous ([place])    Stroke [facility])     Past Medical History, Surgical history, Social history, and Family history were reviewed and updated as appropriate.   Please see review of systems for further details on the patient's review from today.   Objective:   Physical Exam:  There were no vitals taken for this visit.  Physical Exam Constitutional:      General: She is not in acute distress. Musculoskeletal:        General: No deformity.  Neurological:     Mental Status: She is alert and oriented to person, place, and time.     Coordination: Coordination normal.  Psychiatric:        Attention and Perception: Attention and perception normal. She does not perceive auditory or visual hallucinations.        Mood and Affect: Mood normal. Mood is not anxious or depressed. Affect is not labile, blunt, angry or inappropriate.        Speech: Speech normal.        Behavior: Behavior normal.        Thought Content: Thought content normal. Thought content is not paranoid or delusional. Thought content does not include homicidal or suicidal ideation. Thought content does not include homicidal or  suicidal plan.        Cognition and Memory: Cognition and memory normal.        Judgment: Judgment normal.     Comments: Insight intact     Lab Review:     Component Value Date/Time   NA 137 08/30/2021 1128   K 4.4 08/30/2021 1128   CL 104 08/30/2021 1128   CO2 25 08/30/2021 1128   GLUCOSE 103 (H) 08/30/2021 1128   BUN 7 08/30/2021 1128   CREATININE 0.80 08/30/2021 1128   CALCIUM 8.8 (L) 08/30/2021 1128   PROT 6.7 08/30/2021 1128   ALBUMIN 3.7 08/30/2021 1128   AST 21 08/30/2021 1128   ALT 15 08/30/2021 1128   ALKPHOS 85 08/30/2021 1128   BILITOT 1.0 08/30/2021 1128   GFRNONAA >60 08/30/2021 1128   GFRAA >60 12/27/2019 2003       Component Value Date/Time   WBC 9.0 08/30/2021 1128   RBC 4.38 08/30/2021  1128   HGB 13.1 08/30/2021 1128   HCT 38.7 08/30/2021 1128   PLT 265 08/30/2021 1128   MCV 88.4 08/30/2021 1128   MCH 29.9 08/30/2021 1128   MCHC 33.9 08/30/2021 1128   RDW 12.9 08/30/2021 1128   LYMPHSABS 2.2 08/30/2021 1128   MONOABS 0.5 08/30/2021 1128   EOSABS 0.2 08/30/2021 1128   BASOSABS 0.0 08/30/2021 1128    No results found for: "POCLITH", "LITHIUM"   No results found for: "PHENYTOIN", "PHENOBARB", "VALPROATE", "CBMZ"   .res Assessment: Plan:    Plan:  PDMP reviewed  1. Valium 5 mg twice daily - does not take regularly  2. Ritalin 10 mg IR twice daily - M-F 3  Lamictal 200 mg every bedtime  4. Lexapro 20 mg every morning  5. Wellbutrin 150 mg XL taking 3 tablets total 450 mg XL every morning  6. Vraylar 4.5 mg every morning  7. Add Modafinil 71m every morning  Monitor BP between visits while taking stimulant medication.   Discussed Nuvigil for OSA.  RTC 6 months  Patient advised to contact office with any questions, adverse effects, or acute worsening in signs and symptoms.  Discussed potential benefits, risk, and side effects of benzodiazepines to include potential risk of tolerance and dependence, as well as possible drowsiness.   Advised patient not to drive if experiencing drowsiness and to take lowest possible effective dose to minimize risk of dependence and tolerance.   Discussed potential benefits, risks, and side effects of stimulants with patient to include increased heart rate, palpitations, insomnia, increased anxiety, increased irritability, or decreased appetite.  Instructed patient to contact office if experiencing any significant tolerability issues.   Discussed potential metabolic side effects associated with atypical antipsychotics, as well as potential risk for movement side effects. Advised pt to contact office if movement side effects occur.    Counseled patient regarding potential benefits, risks, and side effects of Lamictal to include potential risk of Stevens-Johnson syndrome. Advised patient to stop taking Lamictal and contact office immediately if rash develops and to seek urgent medical attention if rash is severe and/or spreading quickly.   There are no diagnoses linked to this encounter.   Please see After Visit Summary for patient specific instructions.  Future Appointments  Date Time Provider DLorenzo 10/28/2022  1:00 PM Keylie Beavers, RBerdie Ogren NP CP-CP None  02/06/2023  8:00 AM Caelin Rosen, RBerdie Ogren NP CP-CP None    No orders of the defined types were placed in this encounter.   -------------------------------

## 2022-10-31 ENCOUNTER — Telehealth: Payer: Self-pay | Admitting: Adult Health

## 2022-10-31 NOTE — Telephone Encounter (Signed)
Pt reporting new med Modafinil needs PA. Pharmacy sent info on this

## 2022-10-31 NOTE — Telephone Encounter (Signed)
PA needed for Modafinil. We received a fax.

## 2022-11-01 NOTE — Telephone Encounter (Signed)
Prior Approval received through 11/01/2023 on armodafinil 50 mg with [employer]

## 2022-12-04 ENCOUNTER — Telehealth: Payer: Self-pay

## 2022-12-04 NOTE — Telephone Encounter (Addendum)
Prior Authorization Armodafinil 50MG  tablets #30/30 [employer]  Denied

## 2022-12-16 ENCOUNTER — Telehealth: Payer: Self-pay

## 2022-12-16 NOTE — Telephone Encounter (Signed)
[name] called at 2:30 to check status of PA for Armodafinil.  Problem is she changed insurance 11/21/22 to [facility].  We did NOT have that in the system.  It is in there now.  Other problem is the insurance has her name has [name].  She said the card has [name].  She thinks it is because her name is too long.  Anyway, please check to see what is happening with the PA due to these issues.

## 2022-12-18 NOTE — Telephone Encounter (Signed)
Received fax from [place] for PA on Armodafinil 50mg -

## 2022-12-22 NOTE — Telephone Encounter (Signed)
Office notes requested via fax, they have already been faxed for review.

## 2022-12-23 NOTE — Telephone Encounter (Signed)
RX Benefits has DENIED ARMODAFANIL 12/22/2022 Can she use [facility]?

## 2022-12-23 NOTE — Telephone Encounter (Signed)
Rx Benefits did DENY her Armodafinil, they will only consider approval if in consultation with a sleep specialist and if using with a CPAP or BIPAP.   [facility] is an option if you want her to try this medication?

## 2022-12-23 NOTE — Telephone Encounter (Signed)
That is fine 

## 2022-12-24 NOTE — Telephone Encounter (Signed)
Patient notified of PA denial and suggested she use [facility].

## 2022-12-31 ENCOUNTER — Telehealth: Payer: Self-pay | Admitting: Adult Health

## 2022-12-31 NOTE — Telephone Encounter (Signed)
[facility] sent PA Renewal for Vraylar 4.5mg  - see CMM Key#[id number]

## 2023-01-14 NOTE — Telephone Encounter (Signed)
Clarisse Gouge- Can you check with her pharmacy for Vraylar? I received a PA renewal but on her old insurance so not sure if she needs a new PA with Rx Benefits instead.

## 2023-01-19 ENCOUNTER — Telehealth: Payer: Self-pay

## 2023-01-19 NOTE — Telephone Encounter (Signed)
PA submitted with RX Benefits for Vraylar 4.5 mg

## 2023-01-19 NOTE — Telephone Encounter (Signed)
Pt's ID# [id number] EOC #[id number] PA for Vraylar 4.5 mg submitted with [employer] Benefits at rxb.[email or web address]

## 2023-02-04 NOTE — Telephone Encounter (Signed)
Pt's prior approval for VRAYLAR 4.5 MG was DENIED due to her dose above 3 mg, they require documentation pt has acute mania and acute episodes with mixed features for her Bipolar I per RX Benefits   Pt has apt on Friday, May 17th if this can be addressed in her office note for an approval.

## 2023-02-04 NOTE — Telephone Encounter (Signed)
Noted  

## 2023-02-06 ENCOUNTER — Encounter: Payer: Self-pay | Admitting: Adult Health

## 2023-02-06 ENCOUNTER — Telehealth: Payer: BC Managed Care – PPO | Admitting: Adult Health

## 2023-02-06 DIAGNOSIS — G4733 Obstructive sleep apnea (adult) (pediatric): Secondary | ICD-10-CM | POA: Diagnosis not present

## 2023-02-06 DIAGNOSIS — F88 Other disorders of psychological development: Secondary | ICD-10-CM

## 2023-02-06 DIAGNOSIS — F3132 Bipolar disorder, current episode depressed, moderate: Secondary | ICD-10-CM

## 2023-02-06 DIAGNOSIS — F411 Generalized anxiety disorder: Secondary | ICD-10-CM

## 2023-02-06 MED ORDER — ARMODAFINIL 50 MG PO TABS: 50.0000 mg | ORAL_TABLET | Freq: Every day | ORAL | 2 refills | Status: DC

## 2023-02-06 MED ORDER — METHYLPHENIDATE HCL 10 MG PO TABS: 10.0000 mg | ORAL_TABLET | Freq: Two times a day (BID) | ORAL | 0 refills | Status: DC

## 2023-02-06 NOTE — Progress Notes (Signed)
Madison Henry [id number] [birth year] 52 y.o.  Virtual Visit via Video Note  I connected with pt @ on 02/06/23 at  8:00 AM EDT by a video enabled telemedicine application and verified that I am speaking with the correct person using two identifiers.   I discussed the limitations of evaluation and management by telemedicine and the availability of in person appointments. The patient expressed understanding and agreed to proceed.  I discussed the assessment and treatment plan with the patient. The patient was provided an opportunity to ask questions and all were answered. The patient agreed with the plan and demonstrated an understanding of the instructions.   The patient was advised to call back or seek an in-person evaluation if the symptoms worsen or if the condition fails to improve as anticipated.  I provided 25 minutes of non-face-to-face time during this encounter.  The patient was located at home.  The provider was located at [facility].   Dorothyann Gibbs, NP   Subjective:   Patient ID:  Madison Henry is a 51 y.o. (DOB June 22, 1971) female.  Chief Complaint: No chief complaint on file.   HPI Madison Henry presents for follow-up of BPD-1, OSA, GAD, secondary neurodevelopmental disorder.  Describes mood today as "ok". Pleasant. Denies tearfulness. Mood symptoms - denies depression. Reports anxiety and irritability - feeling very agitated. Reports situational stressors with planning daughter's wedding. Reports worry, rumination, and over thinking. Mood is stable. Stating "I'm doing alright".Improved interest and motivation. Does not feel like medications are working for her. Family doing well. Stable interest and motivation. Taking medications as prescribed.  Energy levels lower. Active, does not have a regular exercise routine. Enjoys some usual interests and activities. Married. Lives with husband. Has 3 children. Spending time with family.  Appetite  adequate. Weight stable - 235 pounds. Sleeping better at night - waking up twice. Averages 8 hours. Unable to tolerate CPAP. Focus and concentration stable. Completing tasks. Managing aspects of household. Works full-time a Advertising account executive. Denies SI or HI.  Denies AH or VH. Denies self harm. Denies substance use.  Therapy on Wednesdays.  Previous medication trials: Denies    Review of Systems:  Review of Systems  Musculoskeletal:  Negative for gait problem.  Neurological:  Negative for tremors.  Psychiatric/Behavioral:         Please refer to HPI    Medications: I have reviewed the patient's current medications.  Current Outpatient Medications  Medication Sig Dispense Refill   Armodafinil (NUVIGIL) 50 MG tablet Take 1 tablet (50 mg total) by mouth daily. 30 tablet 2   aspirin 81 MG chewable tablet Chew 1 tablet (81 mg total) by mouth daily. 30 tablet 0   buPROPion (WELLBUTRIN XL) 150 MG 24 hr tablet Take 3 tablets (450 mg total) by mouth daily after breakfast. 270 tablet 3   Cariprazine HCl (VRAYLAR) 4.5 MG CAPS Take 1 capsule (4.5 mg total) by mouth daily after breakfast. 90 capsule 3   diazepam (VALIUM) 5 MG tablet Take 1 tablet (5 mg total) by mouth 2 (two) times daily as needed. 30 tablet 2   escitalopram (LEXAPRO) 20 MG tablet Take 1 tablet (20 mg total) by mouth daily after breakfast. 90 tablet 3   HYDROcodone-acetaminophen (NORCO) 5-325 MG tablet Take 1-2 tablets by mouth every 6 (six) hours as needed. 15 tablet 0   HYDROmorphone (DILAUDID) 2 MG tablet Take 0.5 tablets (1 mg total) by mouth every 6 (six) hours as needed for severe pain. 10 tablet 0  lamoTRIgine (LAMICTAL) 200 MG tablet Take 2 tablets (400 mg total) by mouth at bedtime. 180 tablet 3   methylphenidate (RITALIN) 10 MG tablet Take 1 tablet (10 mg total) by mouth 2 (two) times daily with breakfast and lunch. 60 tablet 0   [START ON 03/06/2023] methylphenidate (RITALIN) 10 MG tablet Take 1 tablet (10 mg total)  by mouth 2 (two) times daily with breakfast and lunch. 60 tablet 0   [START ON 04/03/2023] methylphenidate (RITALIN) 10 MG tablet Take 1 tablet (10 mg total) by mouth 2 (two) times daily with breakfast and lunch. 60 tablet 0   predniSONE (DELTASONE) 10 MG tablet Take 2 tablets (20 mg total) by mouth 2 (two) times daily. 20 tablet 0   No current facility-administered medications for this visit.    Medication Side Effects: None  Allergies:  Allergies  Allergen Reactions   Tape Rash    Extended exposure    Past Medical History:  Diagnosis Date   Factor 5 Leiden mutation, heterozygous (HCC)    Stroke (HCC)     No family history on file.  Social History   Socioeconomic History   Marital status: Married    Spouse name: Not on file   Number of children: Not on file   Years of education: Not on file   Highest education level: Not on file  Occupational History   Not on file  Tobacco Use   Smoking status: Never   Smokeless tobacco: Never  Vaping Use   Vaping Use: Never used  Substance and Sexual Activity   Alcohol use: Not Currently   Drug use: Never   Sexual activity: Not on file  Other Topics Concern   Not on file  Social History Narrative   Not on file   Social Determinants of Health   Financial Resource Strain: Not on file  Food Insecurity: Not on file  Transportation Needs: Not on file  Physical Activity: Not on file  Stress: Not on file  Social Connections: Not on file  Intimate Partner Violence: Not on file    Past Medical History, Surgical history, Social history, and Family history were reviewed and updated as appropriate.   Please see review of systems for further details on the patient's review from today.   Objective:   Physical Exam:  There were no vitals taken for this visit.  Physical Exam Constitutional:      General: She is not in acute distress. Musculoskeletal:        General: No deformity.  Neurological:     Mental Status: She is  alert and oriented to person, place, and time.     Coordination: Coordination normal.  Psychiatric:        Attention and Perception: Attention and perception normal. She does not perceive auditory or visual hallucinations.        Mood and Affect: Mood normal. Mood is not anxious or depressed. Affect is not labile, blunt, angry or inappropriate.        Speech: Speech normal.        Behavior: Behavior normal.        Thought Content: Thought content normal. Thought content is not paranoid or delusional. Thought content does not include homicidal or suicidal ideation. Thought content does not include homicidal or suicidal plan.        Cognition and Memory: Cognition and memory normal.        Judgment: Judgment normal.     Comments: Insight intact     Lab  Review:     Component Value Date/Time   NA 137 08/30/2021 1128   K 4.4 08/30/2021 1128   CL 104 08/30/2021 1128   CO2 25 08/30/2021 1128   GLUCOSE 103 (H) 08/30/2021 1128   BUN 7 08/30/2021 1128   CREATININE 0.80 08/30/2021 1128   CALCIUM 8.8 (L) 08/30/2021 1128   PROT 6.7 08/30/2021 1128   ALBUMIN 3.7 08/30/2021 1128   AST 21 08/30/2021 1128   ALT 15 08/30/2021 1128   ALKPHOS 85 08/30/2021 1128   BILITOT 1.0 08/30/2021 1128   GFRNONAA >60 08/30/2021 1128   GFRAA >60 12/27/2019 2003       Component Value Date/Time   WBC 9.0 08/30/2021 1128   RBC 4.38 08/30/2021 1128   HGB 13.1 08/30/2021 1128   HCT 38.7 08/30/2021 1128   PLT 265 08/30/2021 1128   MCV 88.4 08/30/2021 1128   MCH 29.9 08/30/2021 1128   MCHC 33.9 08/30/2021 1128   RDW 12.9 08/30/2021 1128   LYMPHSABS 2.2 08/30/2021 1128   MONOABS 0.5 08/30/2021 1128   EOSABS 0.2 08/30/2021 1128   BASOSABS 0.0 08/30/2021 1128    No results found for: "POCLITH", "LITHIUM"   No results found for: "PHENYTOIN", "PHENOBARB", "VALPROATE", "CBMZ"   .res Assessment: Plan:    Plan:  PDMP reviewed  1. Valium 5 mg twice daily - does not take regularly  2. Ritalin 10 mg IR  twice daily - M-F 3  Lamictal 200 mg every bedtime  4. Lexapro 20 mg every morning  5. Wellbutrin 150 mg XL taking 3 tablets total 450 mg XL every morning  6. Vraylar 4.5 mg every morning  7. Modafinil 50mg  every morning  Monitor BP between visits while taking stimulant medication.   Discussed Nuvigil for OSA.  RTC 6 months  Patient advised to contact office with any questions, adverse effects, or acute worsening in signs and symptoms.  Discussed potential benefits, risk, and side effects of benzodiazepines to include potential risk of tolerance and dependence, as well as possible drowsiness.  Advised patient not to drive if experiencing drowsiness and to take lowest possible effective dose to minimize risk of dependence and tolerance.   Discussed potential benefits, risks, and side effects of stimulants with patient to include increased heart rate, palpitations, insomnia, increased anxiety, increased irritability, or decreased appetite.  Instructed patient to contact office if experiencing any significant tolerability issues.   Discussed potential metabolic side effects associated with atypical antipsychotics, as well as potential risk for movement side effects. Advised pt to contact office if movement side effects occur.    Counseled patient regarding potential benefits, risks, and side effects of Lamictal to include potential risk of Stevens-Johnson syndrome. Advised patient to stop taking Lamictal and contact office immediately if rash develops and to seek urgent medical attention if rash is severe and/or spreading quickly.   Diagnoses and all orders for this visit:  OSA (obstructive sleep apnea) -     Armodafinil (NUVIGIL) 50 MG tablet; Take 1 tablet (50 mg total) by mouth daily.  Bipolar I disorder, moderate, current or most recent episode depressed, with anxious distress (HCC) -     methylphenidate (RITALIN) 10 MG tablet; Take 1 tablet (10 mg total) by mouth 2 (two) times daily  with breakfast and lunch. -     methylphenidate (RITALIN) 10 MG tablet; Take 1 tablet (10 mg total) by mouth 2 (two) times daily with breakfast and lunch. -     methylphenidate (RITALIN) 10 MG tablet; Take  1 tablet (10 mg total) by mouth 2 (two) times daily with breakfast and lunch.  Secondary neurodevelopmental disorder -     methylphenidate (RITALIN) 10 MG tablet; Take 1 tablet (10 mg total) by mouth 2 (two) times daily with breakfast and lunch. -     methylphenidate (RITALIN) 10 MG tablet; Take 1 tablet (10 mg total) by mouth 2 (two) times daily with breakfast and lunch. -     methylphenidate (RITALIN) 10 MG tablet; Take 1 tablet (10 mg total) by mouth 2 (two) times daily with breakfast and lunch.  Generalized anxiety disorder     Please see After Visit Summary for patient specific instructions.  No future appointments.   No orders of the defined types were placed in this encounter.     -------------------------------

## 2023-03-09 ENCOUNTER — Telehealth: Payer: Self-pay

## 2023-03-10 NOTE — Telephone Encounter (Signed)
Prior authorization submitted for Armodafinil 50 mg tablets

## 2023-06-08 ENCOUNTER — Telehealth: Payer: Self-pay | Admitting: Adult Health

## 2023-06-08 NOTE — Telephone Encounter (Signed)
Husband lvm that Madison Henry needs a refill of her ritalin  10 mg.[facility] [address] in [place]

## 2023-06-15 ENCOUNTER — Telehealth: Payer: Self-pay | Admitting: Adult Health

## 2023-06-15 ENCOUNTER — Other Ambulatory Visit: Payer: Self-pay

## 2023-06-15 DIAGNOSIS — F88 Other disorders of psychological development: Secondary | ICD-10-CM

## 2023-06-15 DIAGNOSIS — F3132 Bipolar disorder, current episode depressed, moderate: Secondary | ICD-10-CM

## 2023-06-15 MED ORDER — METHYLPHENIDATE HCL 10 MG PO TABS
10.0000 mg | ORAL_TABLET | Freq: Two times a day (BID) | ORAL | 0 refills | Status: DC
Start: 2023-06-15 — End: 2023-07-23

## 2023-06-15 NOTE — Telephone Encounter (Signed)
Pt called to request RF on Ritalin. Previous request on 9/16. Please send to: [phone] [place], [place] - [address]  [address] [place], [place] [address]

## 2023-06-15 NOTE — Telephone Encounter (Signed)
PENDED

## 2023-06-24 ENCOUNTER — Other Ambulatory Visit: Payer: Self-pay | Admitting: Adult Health

## 2023-06-24 DIAGNOSIS — G4733 Obstructive sleep apnea (adult) (pediatric): Secondary | ICD-10-CM

## 2023-06-28 ENCOUNTER — Telehealth: Payer: Self-pay

## 2023-06-28 NOTE — Telephone Encounter (Signed)
See other phone message uses Rx Benefits

## 2023-06-28 NOTE — Telephone Encounter (Signed)
Prior Authorization initiated with Rx benefits for Armodafinil 50 mg #30/30 day, EOC# [id number], ID# [id number]

## 2023-06-29 NOTE — Telephone Encounter (Signed)
Prior Approval received effective 06/29/2023 for Armodafinil 50 mg with Rx Benefits.

## 2023-07-23 ENCOUNTER — Telehealth: Payer: Self-pay | Admitting: Adult Health

## 2023-07-23 ENCOUNTER — Other Ambulatory Visit: Payer: Self-pay

## 2023-07-23 DIAGNOSIS — F3132 Bipolar disorder, current episode depressed, moderate: Secondary | ICD-10-CM

## 2023-07-23 DIAGNOSIS — F88 Other disorders of psychological development: Secondary | ICD-10-CM

## 2023-07-23 MED ORDER — METHYLPHENIDATE HCL 10 MG PO TABS: 10.0000 mg | ORAL_TABLET | Freq: Two times a day (BID) | ORAL | 0 refills | Status: DC

## 2023-07-23 NOTE — Telephone Encounter (Signed)
Pended.

## 2023-07-23 NOTE — Telephone Encounter (Signed)
Pt LVM @ 9:17a requesting refill of Ritalin to   [phone] [place],  - [address] [place], [place] [address] Phone: [phone]  Fax: [phone]    Next appt 11/11

## 2023-08-03 ENCOUNTER — Encounter: Payer: Self-pay | Admitting: Adult Health

## 2023-08-03 ENCOUNTER — Other Ambulatory Visit: Payer: Self-pay | Admitting: Adult Health

## 2023-08-03 ENCOUNTER — Telehealth: Payer: BC Managed Care – PPO | Admitting: Adult Health

## 2023-08-03 DIAGNOSIS — F88 Other disorders of psychological development: Secondary | ICD-10-CM

## 2023-08-03 DIAGNOSIS — G4733 Obstructive sleep apnea (adult) (pediatric): Secondary | ICD-10-CM | POA: Diagnosis not present

## 2023-08-03 DIAGNOSIS — F411 Generalized anxiety disorder: Secondary | ICD-10-CM

## 2023-08-03 DIAGNOSIS — F3132 Bipolar disorder, current episode depressed, moderate: Secondary | ICD-10-CM | POA: Diagnosis not present

## 2023-08-03 MED ORDER — BUPROPION HCL ER (XL) 150 MG PO TB24: 450.0000 mg | ORAL_TABLET | Freq: Every day | ORAL | 3 refills | Status: AC

## 2023-08-03 MED ORDER — LAMOTRIGINE 200 MG PO TABS
400.0000 mg | ORAL_TABLET | Freq: Every day | ORAL | 3 refills | Status: AC
Start: 2023-08-03 — End: ?

## 2023-08-03 MED ORDER — VRAYLAR 4.5 MG PO CAPS: 4.5000 mg | ORAL_CAPSULE | Freq: Every day | ORAL | 3 refills | Status: DC

## 2023-08-03 MED ORDER — DIAZEPAM 5 MG PO TABS: 5.0000 mg | ORAL_TABLET | Freq: Two times a day (BID) | ORAL | 2 refills | Status: AC | PRN

## 2023-08-03 MED ORDER — ARMODAFINIL 50 MG PO TABS
50.0000 mg | ORAL_TABLET | Freq: Every day | ORAL | 2 refills | Status: DC
Start: 2023-08-03 — End: 2023-12-14

## 2023-08-03 MED ORDER — METHYLPHENIDATE HCL 10 MG PO TABS: 10.0000 mg | ORAL_TABLET | Freq: Three times a day (TID) | ORAL | 0 refills | Status: DC

## 2023-08-03 MED ORDER — ESCITALOPRAM OXALATE 20 MG PO TABS: 20.0000 mg | ORAL_TABLET | Freq: Every day | ORAL | 3 refills | Status: AC

## 2023-08-03 NOTE — Progress Notes (Signed)
Madison Henry [id number] March 03, 1971 52 y.o.  Virtual Visit via Video Note  I connected with pt @ on 08/03/23 at  8:00 AM EST by a video enabled telemedicine application and verified that I am speaking with the correct person using two identifiers.   I discussed the limitations of evaluation and management by telemedicine and the availability of in person appointments. The patient expressed understanding and agreed to proceed.  I discussed the assessment and treatment plan with the patient. The patient was provided an opportunity to ask questions and all were answered. The patient agreed with the plan and demonstrated an understanding of the instructions.   The patient was advised to call back or seek an in-person evaluation if the symptoms worsen or if the condition fails to improve as anticipated.  I provided 25 minutes of non-face-to-face time during this encounter.  The patient was located at home.  The provider was located at [facility].   Dorothyann Gibbs, NP   Subjective:   Patient ID:  Madison Henry is a 52 y.o. (DOB Oct 28, 1970) female.  Chief Complaint: No chief complaint on file.   HPI Madison Henry presents for follow-up of BPD-1, OSA, GAD, secondary neurodevelopmental disorder.  Describes mood today as "ok". Pleasant. Denies tearfulness. Mood symptoms - denies depression. Reports some situational anxiety and irritability in the workplace. Reports some concerns about health. Has seen several physicians about elevated Alkaline Phosphatase - possibly bone related. Reports some worry, rumination, and over thinking - situational stressors. Mood is stable. Stating "I have a lot going on right now with work and the upcoming holidays". Stable interest and motivation. Family doing well. Taking medications as prescribed. Energy levels lower. Active, does not have a regular exercise routine. Enjoys some usual interests and activities. Married. Lives with  husband. Has 3 children. Spending time with family.  Appetite adequate. Weight gain - 235 to 245 pounds. Sleeping better some nights than others. Averages 8 hours - up twice during the night. Unable to tolerate CPAP - panic. Focus and concentration difficulties over the past month - even with medications. Completing tasks. Managing aspects of household. 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  Denies AH or VH. Denies self harm. Denies substance use.  Therapy on Wednesdays.  Previous medication trials: Denies  Review of Systems:  Review of Systems  Musculoskeletal:  Negative for gait problem.  Neurological:  Negative for tremors.  Psychiatric/Behavioral:         Please refer to HPI    Medications: I have reviewed the patient's current medications.  Current Outpatient Medications  Medication Sig Dispense Refill   Armodafinil 50 MG tablet TAKE 1 TABLET BY MOUTH EVERY DAY 30 tablet 1   aspirin 81 MG chewable tablet Chew 1 tablet (81 mg total) by mouth daily. 30 tablet 0   buPROPion (WELLBUTRIN XL) 150 MG 24 hr tablet Take 3 tablets (450 mg total) by mouth daily after breakfast. 270 tablet 3   Cariprazine HCl (VRAYLAR) 4.5 MG CAPS Take 1 capsule (4.5 mg total) by mouth daily after breakfast. 90 capsule 3   diazepam (VALIUM) 5 MG tablet Take 1 tablet (5 mg total) by mouth 2 (two) times daily as needed. 30 tablet 2   escitalopram (LEXAPRO) 20 MG tablet Take 1 tablet (20 mg total) by mouth daily after breakfast. 90 tablet 3   HYDROcodone-acetaminophen (NORCO) 5-325 MG tablet Take 1-2 tablets by mouth every 6 (six) hours as needed. 15 tablet 0   HYDROmorphone (DILAUDID) 2  MG tablet Take 0.5 tablets (1 mg total) by mouth every 6 (six) hours as needed for severe pain. 10 tablet 0   lamoTRIgine (LAMICTAL) 200 MG tablet Take 2 tablets (400 mg total) by mouth at bedtime. 180 tablet 3   [START ON 09/17/2023] methylphenidate (RITALIN) 10 MG tablet Take 1 tablet (10 mg total) by mouth 2  (two) times daily with breakfast and lunch. 60 tablet 0   [START ON 08/20/2023] methylphenidate (RITALIN) 10 MG tablet Take 1 tablet (10 mg total) by mouth 2 (two) times daily with breakfast and lunch. 60 tablet 0   methylphenidate (RITALIN) 10 MG tablet Take 1 tablet (10 mg total) by mouth 2 (two) times daily with breakfast and lunch. 60 tablet 0   predniSONE (DELTASONE) 10 MG tablet Take 2 tablets (20 mg total) by mouth 2 (two) times daily. 20 tablet 0   No current facility-administered medications for this visit.    Medication Side Effects: None  Allergies:  Allergies  Allergen Reactions   Tape Rash    Extended exposure    Past Medical History:  Diagnosis Date   Factor 5 Leiden mutation, heterozygous (HCC)    Stroke (HCC)     No family history on file.  Social History   Socioeconomic History   Marital status: Married    Spouse name: Not on file   Number of children: Not on file   Years of education: Not on file   Highest education level: Not on file  Occupational History   Not on file  Tobacco Use   Smoking status: Never   Smokeless tobacco: Never  Vaping Use   Vaping status: Never Used  Substance and Sexual Activity   Alcohol use: Not Currently   Drug use: Never   Sexual activity: Not on file  Other Topics Concern   Not on file  Social History Narrative   Not on file   Social Determinants of Health   Financial Resource Strain: Not on file  Food Insecurity: Low Risk  (02/25/2023)   [facility]   Hunger Vital Sign    Worried About Running Out of Food in the Last Year: Never true    Ran Out of Food in the Last Year: Never true  Transportation Needs: No Transportation Needs (02/25/2023)   [facility]   Transportation    In the past 12 months, has lack of reliable transportation kept you from medical appointments, meetings, work or from getting things needed for daily living? : No  Physical Activity:  Insufficiently Active (07/20/2019)   [facility] prior to 11/22/2022., [facility] visits prior to 11/22/2022.   Exercise Vital Sign    Days of Exercise per Week: 2 days    Minutes of Exercise per Session: 20 min  Stress: No Stress Concern Present (07/20/2019)   [facility] visits prior to 11/22/2022., [facility] visits prior to 11/22/2022.   [employer] of Occupational Health - Occupational Stress Questionnaire    Feeling of Stress : Only a little  Social Connections: Unknown (01/28/2022)   [facility] from [facility], [facility]   Social Network    Social Network: Not on file  Intimate Partner Violence: Unknown (12/24/2021)   [facility] from [facility], [facility]   HITS    Physically Hurt: Not on file    Insult or Talk Down To: Not  on file    Threaten Physical Harm: Not on file    Scream or Curse: Not on file    Past Medical History, Surgical history, Social history, and Family history were reviewed and updated as appropriate.   Please see review of systems for further details on the patient's review from today.   Objective:   Physical Exam:  There were no vitals taken for this visit.  Physical Exam Constitutional:      General: She is not in acute distress. Musculoskeletal:        General: No deformity.  Neurological:     Mental Status: She is alert and oriented to person, place, and time.     Coordination: Coordination normal.  Psychiatric:        Attention and Perception: Attention and perception normal. She does not perceive auditory or visual hallucinations.        Mood and Affect: Mood normal. Mood is not anxious or depressed. Affect is not labile, blunt, angry or inappropriate.        Speech: Speech normal.        Behavior: Behavior normal.        Thought Content: Thought content normal. Thought content is not paranoid or delusional.  Thought content does not include homicidal or suicidal ideation. Thought content does not include homicidal or suicidal plan.        Cognition and Memory: Cognition and memory normal.        Judgment: Judgment normal.     Comments: Insight intact     Lab Review:     Component Value Date/Time   NA 137 08/30/2021 1128   K 4.4 08/30/2021 1128   CL 104 08/30/2021 1128   CO2 25 08/30/2021 1128   GLUCOSE 103 (H) 08/30/2021 1128   BUN 7 08/30/2021 1128   CREATININE 0.80 08/30/2021 1128   CALCIUM 8.8 (L) 08/30/2021 1128   PROT 6.7 08/30/2021 1128   ALBUMIN 3.7 08/30/2021 1128   AST 21 08/30/2021 1128   ALT 15 08/30/2021 1128   ALKPHOS 85 08/30/2021 1128   BILITOT 1.0 08/30/2021 1128   GFRNONAA >60 08/30/2021 1128   GFRAA >60 12/27/2019 2003       Component Value Date/Time   WBC 9.0 08/30/2021 1128   RBC 4.38 08/30/2021 1128   HGB 13.1 08/30/2021 1128   HCT 38.7 08/30/2021 1128   PLT 265 08/30/2021 1128   MCV 88.4 08/30/2021 1128   MCH 29.9 08/30/2021 1128   MCHC 33.9 08/30/2021 1128   RDW 12.9 08/30/2021 1128   LYMPHSABS 2.2 08/30/2021 1128   MONOABS 0.5 08/30/2021 1128   EOSABS 0.2 08/30/2021 1128   BASOSABS 0.0 08/30/2021 1128    No results found for: "POCLITH", "LITHIUM"   No results found for: "PHENYTOIN", "PHENOBARB", "VALPROATE", "CBMZ"   .res Assessment: Plan:     Plan:  PDMP reviewed  1. Valium 5 mg twice daily - does not take regularly  2. Ritalin 10 mg IR twice to three times daily - M-F. 3  Lamictal 200 mg every bedtime  4. Lexapro 20 mg every morning  5. Wellbutrin 150 mg XL taking 3 tablets total 450 mg XL every morning  6. Vraylar 4.5 mg every morning  7. Modafinil 50mg  every morning  Monitor BP between visits while taking stimulant medication.   Discussed Nuvigil for OSA.  RTC 3 months  Patient advised to contact office with any questions, adverse effects, or acute worsening in signs and symptoms.  Discussed potential benefits,  risk, and  side effects of benzodiazepines to include potential risk of tolerance and dependence, as well as possible drowsiness.  Advised patient not to drive if experiencing drowsiness and to take lowest possible effective dose to minimize risk of dependence and tolerance.   Discussed potential benefits, risks, and side effects of stimulants with patient to include increased heart rate, palpitations, insomnia, increased anxiety, increased irritability, or decreased appetite.  Instructed patient to contact office if experiencing any significant tolerability issues.   Discussed potential metabolic side effects associated with atypical antipsychotics, as well as potential risk for movement side effects. Advised pt to contact office if movement side effects occur.    Counseled patient regarding potential benefits, risks, and side effects of Lamictal to include potential risk of Stevens-Johnson syndrome. Advised patient to stop taking Lamictal and contact office immediately if rash develops and to seek urgent medical attention if rash is severe and/or spreading quickly.   There are no diagnoses linked to this encounter.   Please see After Visit Summary for patient specific instructions.  Future Appointments  Date Time Provider Department Center  08/03/2023  8:00 AM Hila Bolding, Thereasa Solo, NP [facility] None    No orders of the defined types were placed in this encounter.     -------------------------------

## 2023-08-05 NOTE — Telephone Encounter (Signed)
Will look through previous PA's I feel like this was denied but will check first and verify current insurance.

## 2023-08-10 NOTE — Telephone Encounter (Signed)
Prior Authorization submitted through RX Benefits for Vraylar 4.5 mg EOC# [id number] ID# [id number]

## 2023-08-23 ENCOUNTER — Other Ambulatory Visit: Payer: Self-pay | Admitting: Adult Health

## 2023-08-23 ENCOUNTER — Telehealth: Payer: Self-pay

## 2023-08-23 DIAGNOSIS — F3132 Bipolar disorder, current episode depressed, moderate: Secondary | ICD-10-CM

## 2023-08-23 DIAGNOSIS — F88 Other disorders of psychological development: Secondary | ICD-10-CM

## 2023-08-23 NOTE — Telephone Encounter (Signed)
Prior authorization approved for Vraylar 4.5 mg with Rx Benefits effective 08/11/2023, EOC# [id number]   Prior Authorization initiated and submitted for Methylphenidate 10 mg #90/30 day with Rx Benefits, EOC# [id number], pending response

## 2023-08-25 NOTE — Telephone Encounter (Signed)
Prior approval received effective 08/24/2023-08/22/2025 for Methylphenidate 10 mg tablet with Rx Benefits.

## 2023-12-08 ENCOUNTER — Telehealth: Payer: Self-pay | Admitting: Adult Health

## 2023-12-08 NOTE — Telephone Encounter (Signed)
 Pt called asking for a refill on her ritalin 10 mg. [facility] [address] [facility] [place]. Next appt 12/21/23

## 2023-12-09 ENCOUNTER — Other Ambulatory Visit: Payer: Self-pay

## 2023-12-09 DIAGNOSIS — F88 Other disorders of psychological development: Secondary | ICD-10-CM

## 2023-12-09 DIAGNOSIS — F3132 Bipolar disorder, current episode depressed, moderate: Secondary | ICD-10-CM

## 2023-12-09 MED ORDER — METHYLPHENIDATE HCL 10 MG PO TABS: 10.0000 mg | ORAL_TABLET | Freq: Two times a day (BID) | ORAL | 0 refills | Status: DC

## 2023-12-09 NOTE — Telephone Encounter (Signed)
 Pended Ritalin 10 mg

## 2023-12-14 ENCOUNTER — Other Ambulatory Visit: Payer: Self-pay | Admitting: Adult Health

## 2023-12-14 DIAGNOSIS — G4733 Obstructive sleep apnea (adult) (pediatric): Secondary | ICD-10-CM

## 2023-12-21 ENCOUNTER — Encounter: Payer: Self-pay | Admitting: Adult Health

## 2023-12-21 ENCOUNTER — Telehealth (INDEPENDENT_AMBULATORY_CARE_PROVIDER_SITE_OTHER): Admitting: Adult Health

## 2023-12-21 DIAGNOSIS — F411 Generalized anxiety disorder: Secondary | ICD-10-CM

## 2023-12-21 DIAGNOSIS — F88 Other disorders of psychological development: Secondary | ICD-10-CM

## 2023-12-21 DIAGNOSIS — F3132 Bipolar disorder, current episode depressed, moderate: Secondary | ICD-10-CM

## 2023-12-21 DIAGNOSIS — G4733 Obstructive sleep apnea (adult) (pediatric): Secondary | ICD-10-CM

## 2023-12-21 MED ORDER — METHYLPHENIDATE HCL 10 MG PO TABS: 10.0000 mg | ORAL_TABLET | Freq: Two times a day (BID) | ORAL | 0 refills | Status: AC

## 2023-12-21 MED ORDER — METHYLPHENIDATE HCL 10 MG PO TABS: 10.0000 mg | ORAL_TABLET | Freq: Three times a day (TID) | ORAL | 0 refills | Status: AC

## 2023-12-21 NOTE — Progress Notes (Signed)
 Madison Henry [id number] [birth year] 53 y.o.  Virtual Visit via Video Note  I connected with pt @ on 12/21/23 at  9:30 AM EDT by a video enabled telemedicine application and verified that I am speaking with the correct person using two identifiers.   I discussed the limitations of evaluation and management by telemedicine and the availability of in person appointments. The patient expressed understanding and agreed to proceed.  I discussed the assessment and treatment plan with the patient. The patient was provided an opportunity to ask questions and all were answered. The patient agreed with the plan and demonstrated an understanding of the instructions.   The patient was advised to call back or seek an in-person evaluation if the symptoms worsen or if the condition fails to improve as anticipated.  I provided 30 minutes of non-face-to-face time during this encounter.  The patient was located at home.  The provider was located at [facility].   Madison Gibbs, NP   Subjective:   Patient ID:  Madison Henry is a 53 y.o. (DOB May 08, 1971) female.  Chief Complaint: No chief complaint on file.   HPI Madison Henry presents for follow-up of BPD-1, OSA, GAD, secondary neurodevelopmental disorder.  Describes mood today as "concerned". Pleasant. Denies tearfulness. Mood symptoms - reports depression, anxiety and irritability - "more situational". Reports stable interest and motivation. Reports increased health concerns - recently diagnosed with low Vitamin B levels. Working with both PCP and neurology to manage treatment options - plans to start B12 injections. Denies panic attacks. Denies worry, rumination, and over thinking. Mood is stable. Stating "I feel like I'm doing ok". Taking medications as prescribed. Energy levels vary. Active, does not have a regular exercise routine. Enjoys some usual interests and activities. Married. Lives with husband. Has 3  children. Spending time with family.  Appetite adequate. Weight gain - 235 to 240 pounds. Sleeping better some nights than others. Averages 8 hours - up twice during the night. Unable to tolerate CPAP. Reports focus and concentration difficulties over the past several months. Completing tasks. Managing aspects of household. 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  Denies AH or VH. Denies self harm. Denies substance use.  Therapy on Wednesdays.  Previous medication trials: Denies  Review of Systems:  Review of Systems  Musculoskeletal:  Negative for gait problem.  Neurological:  Negative for tremors.  Psychiatric/Behavioral:         Please refer to HPI    Medications: I have reviewed the patient's current medications.  Current Outpatient Medications  Medication Sig Dispense Refill   Armodafinil 50 MG tablet TAKE 1 TABLET BY MOUTH EVERY DAY 30 tablet 2   aspirin 81 MG chewable tablet Chew 1 tablet (81 mg total) by mouth daily. 30 tablet 0   buPROPion (WELLBUTRIN XL) 150 MG 24 hr tablet Take 3 tablets (450 mg total) by mouth daily after breakfast. 270 tablet 3   diazepam (VALIUM) 5 MG tablet Take 1 tablet (5 mg total) by mouth 2 (two) times daily as needed. 30 tablet 2   escitalopram (LEXAPRO) 20 MG tablet Take 1 tablet (20 mg total) by mouth daily after breakfast. 90 tablet 3   HYDROcodone-acetaminophen (NORCO) 5-325 MG tablet Take 1-2 tablets by mouth every 6 (six) hours as needed. 15 tablet 0   HYDROmorphone (DILAUDID) 2 MG tablet Take 0.5 tablets (1 mg total) by mouth every 6 (six) hours as needed for severe pain. 10 tablet 0   lamoTRIgine (LAMICTAL) 200 MG tablet  Take 2 tablets (400 mg total) by mouth at bedtime. 180 tablet 3   methylphenidate (RITALIN) 10 MG tablet Take 1 tablet (10 mg total) by mouth 2 (two) times daily with breakfast and lunch. 60 tablet 0   methylphenidate (RITALIN) 10 MG tablet Take 1 tablet (10 mg total) by mouth 3 (three) times daily. 90 tablet 0    methylphenidate (RITALIN) 10 MG tablet Take 1 tablet (10 mg total) by mouth 2 (two) times daily with breakfast and lunch. 60 tablet 0   predniSONE (DELTASONE) 10 MG tablet Take 2 tablets (20 mg total) by mouth 2 (two) times daily. 20 tablet 0   VRAYLAR 4.5 MG CAPS TAKE 1 CAPSULE (4.5 MG TOTAL) BY MOUTH DAILY AFTER BREAKFAST. 90 capsule 3   No current facility-administered medications for this visit.    Medication Side Effects: None  Allergies:  Allergies  Allergen Reactions   Tape Rash    Extended exposure    Past Medical History:  Diagnosis Date   Factor 5 Leiden mutation, heterozygous (HCC)    Stroke (HCC)     No family history on file.  Social History   Socioeconomic History   Marital status: Married    Spouse name: Not on file   Number of children: Not on file   Years of education: Not on file   Highest education level: Not on file  Occupational History   Not on file  Tobacco Use   Smoking status: Never   Smokeless tobacco: Never  Vaping Use   Vaping status: Never Used  Substance and Sexual Activity   Alcohol use: Not Currently   Drug use: Never   Sexual activity: Not on file  Other Topics Concern   Not on file  Social History Narrative   Not on file   Social Drivers of Health   Financial Resource Strain: Not on file  Food Insecurity: Low Risk  (02/25/2023)   [facility]   Hunger Vital Sign    Worried About Running Out of Food in the Last Year: Never true    Ran Out of Food in the Last Year: Never true  Transportation Needs: No Transportation Needs (02/25/2023)   [facility]   Transportation    In the past 12 months, has lack of reliable transportation kept you from medical appointments, meetings, work or from getting things needed for daily living? : No  Physical Activity: Insufficiently Active (07/20/2019)   Received from [facility] visits prior to 11/22/2022., [facility] visits prior to 11/22/2022.   Exercise Vital Sign    Days of Exercise per Week: 2 days    Minutes of Exercise per Session: 20 min  Stress: No Stress Concern Present (07/20/2019)   [facility] prior to 11/22/2022., [facility] visits prior to 11/22/2022.   [employer] of Occupational Health - Occupational Stress Questionnaire    Feeling of Stress : Only a little  Social Connections: Unknown (01/28/2022)   Received from [facility], [facility]   Social Network    Social Network: Not on file  Intimate Partner Violence: Unknown (12/24/2021)   Received from [facility], [facility]   HITS    Physically Hurt: Not on file    Insult or Talk Down To: Not on file    Threaten Physical Harm: Not on file    Scream or Curse: Not on  file    Past Medical History, Surgical history, Social history, and Family history were reviewed and updated as appropriate.   Please see review of systems for further details on the patient's review from today.   Objective:   Physical Exam:  There were no vitals taken for this visit.  Physical Exam Constitutional:      General: She is not in acute distress. Musculoskeletal:        General: No deformity.  Neurological:     Mental Status: She is alert and oriented to person, place, and time.     Coordination: Coordination normal.  Psychiatric:        Attention and Perception: Attention and perception normal. She does not perceive auditory or visual hallucinations.        Mood and Affect: Affect is not labile, blunt, angry or inappropriate.        Speech: Speech normal.        Behavior: Behavior normal.        Thought Content: Thought content normal. Thought content is not paranoid or delusional. Thought content does not include homicidal or suicidal ideation. Thought content does not include homicidal or suicidal plan.        Cognition and Memory: Cognition and  memory normal.        Judgment: Judgment normal.     Comments: Insight intact     Lab Review:     Component Value Date/Time   NA 137 08/30/2021 1128   K 4.4 08/30/2021 1128   CL 104 08/30/2021 1128   CO2 25 08/30/2021 1128   GLUCOSE 103 (H) 08/30/2021 1128   BUN 7 08/30/2021 1128   CREATININE 0.80 08/30/2021 1128   CALCIUM 8.8 (L) 08/30/2021 1128   PROT 6.7 08/30/2021 1128   ALBUMIN 3.7 08/30/2021 1128   AST 21 08/30/2021 1128   ALT 15 08/30/2021 1128   ALKPHOS 85 08/30/2021 1128   BILITOT 1.0 08/30/2021 1128   GFRNONAA >60 08/30/2021 1128   GFRAA >60 12/27/2019 2003       Component Value Date/Time   WBC 9.0 08/30/2021 1128   RBC 4.38 08/30/2021 1128   HGB 13.1 08/30/2021 1128   HCT 38.7 08/30/2021 1128   PLT 265 08/30/2021 1128   MCV 88.4 08/30/2021 1128   MCH 29.9 08/30/2021 1128   MCHC 33.9 08/30/2021 1128   RDW 12.9 08/30/2021 1128   LYMPHSABS 2.2 08/30/2021 1128   MONOABS 0.5 08/30/2021 1128   EOSABS 0.2 08/30/2021 1128   BASOSABS 0.0 08/30/2021 1128    No results found for: "POCLITH", "LITHIUM"   No results found for: "PHENYTOIN", "PHENOBARB", "VALPROATE", "CBMZ"   .res Assessment: Plan:    Plan:  PDMP reviewed  Valium 5 mg twice daily - does not take regularly   Ritalin 10 mg IR twice to three times daily - M-F. Lamictal 200 mg (2 tablets) every bedtime  Lexapro 20 mg every morning  Wellbutrin 150 mg XL taking 3 tablets total 450 mg XL every morning   D/C Vraylar 4.5 mg every morning - stopped by nuerology D/C Modafinil 50mg  every morning - stopped by PCP  RTC 6 weeks   30 minutes spent dedicated to the care of this patient on the date of this encounter to include pre-visit review of records, ordering of medication, post visit documentation, and face-to-face time with the patient discussing BPD-1, OSA, GAD, secondary neurodevelopmental disorder. Discussed changes to   current medication - discontinuation of Vraylar and Nuvigil.   Patient  advised to contact office with any questions, adverse effects, or acute worsening in signs and symptoms.  Discussed potential benefits, risk, and side effects of benzodiazepines to include potential risk of tolerance and dependence, as well as possible drowsiness.  Advised patient not to drive if experiencing drowsiness and to take lowest possible effective dose to minimize risk of dependence and tolerance.   Discussed potential metabolic side effects associated with atypical antipsychotics, as well as potential risk for movement side effects. Advised pt to contact office if movement side effects occur.    Counseled patient regarding potential benefits, risks, and side effects of Lamictal to include potential risk of Stevens-Johnson syndrome. Advised patient to stop taking Lamictal and contact office immediately if rash develops and to seek urgent medical attention if rash is severe and/or spreading quickly.   There are no diagnoses linked to this encounter.   Please see After Visit Summary for patient specific instructions.  Future Appointments  Date Time Provider Department Center  12/21/2023  9:30 AM Estel Tonelli, Thereasa Solo, NP CP-CP None    No orders of the defined types were placed in this encounter.     -------------------------------

## 2024-03-10 ENCOUNTER — Telehealth: Payer: Self-pay

## 2024-03-10 NOTE — Telephone Encounter (Signed)
 Gina requests a visit to discuss restarting meds.  Will you please see if Bonnell Butcher has an earlier appt.

## 2024-03-10 NOTE — Telephone Encounter (Signed)
 Patient reports being in the hospital for sepsis and GI issues for a couple of weeks until she had seen GI in [place]. She saw GI today and was told she could restart all meds. They stopped all her medications except for Lamictal .  She is wanting to know how to restart them.  Valium  5 mg twice daily - does not take regularly    Ritalin  10 mg IR twice to three times daily - M-F. Lamictal  200 mg (2 tablets) every bedtime  Lexapro  20 mg every morning  Wellbutrin  150 mg XL taking 3 tablets total 450 mg XL every morning

## 2024-03-21 ENCOUNTER — Encounter: Payer: Self-pay | Admitting: Adult Health

## 2024-03-21 ENCOUNTER — Telehealth (INDEPENDENT_AMBULATORY_CARE_PROVIDER_SITE_OTHER): Admitting: Adult Health

## 2024-03-21 DIAGNOSIS — G4733 Obstructive sleep apnea (adult) (pediatric): Secondary | ICD-10-CM | POA: Diagnosis not present

## 2024-03-21 DIAGNOSIS — F411 Generalized anxiety disorder: Secondary | ICD-10-CM | POA: Diagnosis not present

## 2024-03-21 DIAGNOSIS — F3132 Bipolar disorder, current episode depressed, moderate: Secondary | ICD-10-CM | POA: Diagnosis not present

## 2024-03-21 DIAGNOSIS — F88 Other disorders of psychological development: Secondary | ICD-10-CM | POA: Diagnosis not present

## 2024-03-21 MED ORDER — SERTRALINE HCL 50 MG PO TABS: 50.0000 mg | ORAL_TABLET | Freq: Every day | ORAL | 2 refills | Status: DC

## 2024-03-21 NOTE — Progress Notes (Signed)
 Madison Henry [id number] Jan 28, 1971 53 y.o.  Virtual Visit via Video Note  I connected with pt @ on 03/21/24 at  9:00 AM EDT by a video enabled telemedicine application and verified that I am speaking with the correct person using two identifiers.   I discussed the limitations of evaluation and management by telemedicine and the availability of in person appointments. The patient expressed understanding and agreed to proceed.  I discussed the assessment and treatment plan with the patient. The patient was provided an opportunity to ask questions and all were answered. The patient agreed with the plan and demonstrated an understanding of the instructions.   The patient was advised to call back or seek an in-person evaluation if the symptoms worsen or if the condition fails to improve as anticipated.  I provided 25 minutes of non-face-to-face time during this encounter.  The patient was located at home.  The provider was located at [facility].   Angeline LOISE Sayers, NP   Subjective:   Patient ID:  Madison Henry is a 53 y.o. (DOB 1971/03/16) female.  Chief Complaint: No chief complaint on file.   HPI Tahnee Henry presents for follow-up of BPD-1, OSA, GAD, secondary neurodevelopmental disorder.  Describes mood today as not too good. Pleasant. Reports tearfulness. Mood symptoms - reports depression, anxiety and irritability. Reports lower interest and motivation. Denies panic attacks. Reports some worry, rumination, and over thinking - health concerns. Mood is low. Stating I feel like I've been struggling. Taking medications as prescribed. Feels extremely frustrated with recent illness. Diagnosed with sepsis in April - hospitalized for 5 days. Reports she broke her ankle following hospital stay. Reports she is now at home recovering. Reports all her medications have been discontinued with exception of Lamictal  200mg  twice daily. Reports concerns about her  health and how to move forward. Working with both PCP, GI, orthopedics and neurology to manage treatment options. Energy levels lower. Active, does not have a regular exercise routine. Enjoys some usual interests and activities. Married. Lives with husband. Has 3 children. Spending time with family.  Appetite adequate. Weight loss - 235 pounds. Sleeping better some nights than others. Averages 4 to 6 hours - up and down during the night. Unable to tolerate CPAP. Reports focus and concentration difficulties.  Completing tasks. Managing aspects of household. 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  Denies AH or VH. Denies self harm. Denies substance use.  Therapy on Wednesdays.  Previous medication trials: Denies  Review of Systems:  Review of Systems  Musculoskeletal:  Negative for gait problem.  Neurological:  Negative for tremors.  Psychiatric/Behavioral:         Please refer to HPI    Medications: I have reviewed the patient's current medications.  Current Outpatient Medications  Medication Sig Dispense Refill   Armodafinil  50 MG tablet TAKE 1 TABLET BY MOUTH EVERY DAY 30 tablet 2   aspirin  81 MG chewable tablet Chew 1 tablet (81 mg total) by mouth daily. 30 tablet 0   buPROPion  (WELLBUTRIN  XL) 150 MG 24 hr tablet Take 3 tablets (450 mg total) by mouth daily after breakfast. 270 tablet 3   diazepam  (VALIUM ) 5 MG tablet Take 1 tablet (5 mg total) by mouth 2 (two) times daily as needed. 30 tablet 2   escitalopram  (LEXAPRO ) 20 MG tablet Take 1 tablet (20 mg total) by mouth daily after breakfast. 90 tablet 3   HYDROcodone -acetaminophen  (NORCO) 5-325 MG tablet Take 1-2 tablets by mouth every 6 (six) hours  as needed. 15 tablet 0   HYDROmorphone  (DILAUDID ) 2 MG tablet Take 0.5 tablets (1 mg total) by mouth every 6 (six) hours as needed for severe pain. 10 tablet 0   lamoTRIgine  (LAMICTAL ) 200 MG tablet Take 2 tablets (400 mg total) by mouth at bedtime. 180 tablet 3    methylphenidate  (RITALIN ) 10 MG tablet Take 1 tablet (10 mg total) by mouth 2 (two) times daily with breakfast and lunch. 60 tablet 0   methylphenidate  (RITALIN ) 10 MG tablet Take 1 tablet (10 mg total) by mouth 3 (three) times daily. 90 tablet 0   methylphenidate  (RITALIN ) 10 MG tablet Take 1 tablet (10 mg total) by mouth 2 (two) times daily with breakfast and lunch. 60 tablet 0   predniSONE  (DELTASONE ) 10 MG tablet Take 2 tablets (20 mg total) by mouth 2 (two) times daily. 20 tablet 0   No current facility-administered medications for this visit.    Medication Side Effects: None  Allergies:  Allergies  Allergen Reactions   Tape Rash    Extended exposure    Past Medical History:  Diagnosis Date   Factor 5 Leiden mutation, heterozygous (HCC)    Stroke (HCC)     No family history on file.  Social History   Socioeconomic History   Marital status: Married    Spouse name: Not on file   Number of children: Not on file   Years of education: Not on file   Highest education level: Not on file  Occupational History   Not on file  Tobacco Use   Smoking status: Never   Smokeless tobacco: Never  Vaping Use   Vaping status: Never Used  Substance and Sexual Activity   Alcohol use: Not Currently   Drug use: Never   Sexual activity: Not on file  Other Topics Concern   Not on file  Social History Narrative   Not on file   Social Drivers of Health   Financial Resource Strain: Not on file  Food Insecurity: Low Risk  (01/25/2024)   [facility]   Hunger Vital Sign    Within the past 12 months, you worried that your food would run out before you got money to buy more: Never true    Within the past 12 months, the food you bought just didn't last and you didn't have money to get more. : Never true  Transportation Needs: No Transportation Needs (01/25/2024)   Received from [facility]    In the past 12 months, has lack of reliable transportation kept  you from medical appointments, meetings, work or from getting things needed for daily living? : No  Physical Activity: Insufficiently Active (07/20/2019)   Received from [facility] visits prior to 11/22/2022.   Exercise Vital Sign    On average, how many days per week do you engage in moderate to strenuous exercise (like a brisk walk)?: 2 days    On average, how many minutes do you engage in exercise at this level?: 20 min  Stress: No Stress Concern Present (07/20/2019)   [facility] prior to 11/22/2022.   [employer] of Occupational Health - Occupational Stress Questionnaire    Feeling of Stress : Only a little  Social Connections: Unknown (01/28/2022)   [facility]   Social Network    Social Network: Not on file  Intimate Partner Violence: Not At Risk (01/04/2024)   [facility]   HITS    Over the last 12 months how often did your partner physically hurt you?: Never    Over the last 12 months how often did your partner insult you or talk down to you?: Never    Over the last 12 months how often did your partner threaten you with physical harm?: Never    Over the last 12 months how often did your partner scream or curse at you?: Never    Past Medical History, Surgical history, Social history, and Family history were reviewed and updated as appropriate.   Please see review of systems for further details on the patient's review from today.   Objective:   Physical Exam:  There were no vitals taken for this visit.  Physical Exam Constitutional:      General: She is not in acute distress.  Musculoskeletal:        General: No deformity.   Neurological:     Mental Status: She is alert and oriented to person, place, and time.     Coordination: Coordination normal.   Psychiatric:        Attention and Perception: Attention and perception normal. She does not perceive auditory or visual  hallucinations.        Mood and Affect: Mood normal. Mood is not anxious or depressed. Affect is not labile, blunt, angry or inappropriate.        Speech: Speech normal.        Behavior: Behavior normal.        Thought Content: Thought content normal. Thought content is not paranoid or delusional. Thought content does not include homicidal or suicidal ideation. Thought content does not include homicidal or suicidal plan.        Cognition and Memory: Cognition and memory normal.        Judgment: Judgment normal.     Comments: Insight intact     Lab Review:     Component Value Date/Time   NA 137 08/30/2021 1128   K 4.4 08/30/2021 1128   CL 104 08/30/2021 1128   CO2 25 08/30/2021 1128   GLUCOSE 103 (H) 08/30/2021 1128   BUN 7 08/30/2021 1128   CREATININE 0.80 08/30/2021 1128   CALCIUM 8.8 (L) 08/30/2021 1128   PROT 6.7 08/30/2021 1128   ALBUMIN 3.7 08/30/2021 1128   AST 21 08/30/2021 1128   ALT 15 08/30/2021 1128   ALKPHOS 85 08/30/2021 1128   BILITOT 1.0 08/30/2021 1128   GFRNONAA >60 08/30/2021 1128   GFRAA >60 12/27/2019 2003       Component Value Date/Time   WBC 9.0 08/30/2021 1128   RBC 4.38 08/30/2021 1128   HGB 13.1 08/30/2021 1128   HCT 38.7 08/30/2021 1128   PLT 265 08/30/2021 1128   MCV 88.4 08/30/2021 1128   MCH 29.9 08/30/2021 1128   MCHC 33.9 08/30/2021 1128   RDW 12.9 08/30/2021 1128   LYMPHSABS 2.2 08/30/2021 1128   MONOABS 0.5 08/30/2021 1128   EOSABS 0.2 08/30/2021 1128   BASOSABS 0.0 08/30/2021 1128    No results found for: POCLITH, LITHIUM   No results found for: PHENYTOIN, PHENOBARB, VALPROATE, CBMZ   .res Assessment: Plan:    Plan:  PDMP reviewed  Add Zoloft 50mg  daily  Lamictal  200 mg (2 tablets) every bedtime   RTC 4 weeks  30 minutes spent dedicated to the care of this patient on the date of this encounter to include pre-visit review of records, ordering of medication, post visit  documentation, and face-to-face time  with the patient discussing BPD-1, OSA, GAD, secondary neurodevelopmental disorder. Patient reports previous medications were discontinued while hospitalized. She is currently taking Lamictal  200mg  BID, but does not feel like it's helping to manage mood symptoms. She is willing to try a low dose of Zoloft for mood symptoms. Patient advised to contact office with any questions, adverse effects, or acute worsening in signs and symptoms.  Discussed potential benefits, risk, and side effects of benzodiazepines to include potential risk of tolerance and dependence, as well as possible drowsiness.  Advised patient not to drive if experiencing drowsiness and to take lowest possible effective dose to minimize risk of dependence and tolerance.   Discussed potential metabolic side effects associated with atypical antipsychotics, as well as potential risk for movement side effects. Advised pt to contact office if movement side effects occur.    Counseled patient regarding potential benefits, risks, and side effects of Lamictal  to include potential risk of Stevens-Johnson syndrome. Advised patient to stop taking Lamictal  and contact office immediately if rash develops and to seek urgent medical attention if rash is severe and/or spreading quickly.   There are no diagnoses linked to this encounter.   Please see After Visit Summary for patient specific instructions.  Future Appointments  Date Time Provider Department Center  03/21/2024  9:00 AM Tesia Lybrand Nattalie, NP CP-CP None    No orders of the defined types were placed in this encounter.     -------------------------------

## 2024-04-12 ENCOUNTER — Other Ambulatory Visit: Payer: Self-pay | Admitting: Adult Health

## 2024-04-12 DIAGNOSIS — F411 Generalized anxiety disorder: Secondary | ICD-10-CM

## 2024-04-12 DIAGNOSIS — F3132 Bipolar disorder, current episode depressed, moderate: Secondary | ICD-10-CM

## 2024-04-12 DIAGNOSIS — F88 Other disorders of psychological development: Secondary | ICD-10-CM

## 2024-04-18 ENCOUNTER — Telehealth (INDEPENDENT_AMBULATORY_CARE_PROVIDER_SITE_OTHER): Admitting: Adult Health

## 2024-04-18 ENCOUNTER — Encounter: Payer: Self-pay | Admitting: Adult Health

## 2024-04-18 DIAGNOSIS — F88 Other disorders of psychological development: Secondary | ICD-10-CM | POA: Diagnosis not present

## 2024-04-18 DIAGNOSIS — F411 Generalized anxiety disorder: Secondary | ICD-10-CM | POA: Diagnosis not present

## 2024-04-18 DIAGNOSIS — F3132 Bipolar disorder, current episode depressed, moderate: Secondary | ICD-10-CM | POA: Diagnosis not present

## 2024-04-18 MED ORDER — SERTRALINE HCL 100 MG PO TABS: 100.0000 mg | ORAL_TABLET | Freq: Every day | ORAL | 1 refills | Status: DC

## 2024-04-18 NOTE — Progress Notes (Signed)
 Madison Henry [id number] [birth year] 53 y.o.  Virtual Visit via Video Note  I connected with pt @ on 04/18/24 at  9:00 AM EDT by a video enabled telemedicine application and verified that I am speaking with the correct person using two identifiers.   I discussed the limitations of evaluation and management by telemedicine and the availability of in person appointments. The patient expressed understanding and agreed to proceed.  I discussed the assessment and treatment plan with the patient. The patient was provided an opportunity to ask questions and all were answered. The patient agreed with the plan and demonstrated an understanding of the instructions.   The patient was advised to call back or seek an in-person evaluation if the symptoms worsen or if the condition fails to improve as anticipated.  I provided 20 minutes of non-face-to-face time during this encounter.  The patient was located at home.  The provider was located at [facility].   Madison LOISE Sayers, NP   Subjective:   Patient ID:  Madison Henry is a 53 y.o. (DOB October 10, 1970) female.  Chief Complaint: No chief complaint on file.   HPI Madison Henry presents for follow-up of  BPD-1, OSA, GAD, secondary neurodevelopmental disorder.  Describes mood today as a little better. Pleasant. Denies tearfulness. Mood symptoms - reports decreased depression, anxiety and irritability with addition on Zoloft . Reports feeling less agitated and snappy. Stating I'm feeling more hopeful than I was. Reports some new findings concerning her health and is feeling more optimistic. She will be having surgery in September. Reports lower interest and motivation. Denies panic attacks. Reports decreased worry, rumination and over thinking - regarding health concerns. Reports mood is lower. Stating I feel like things are less intense. Reports the addition of Zoloft  has been helpful for mood symptoms, but would like to  increase the dose of Zoloft . Taking medications as prescribed.  Energy levels lower. Active, does not have a regular exercise routine. Unable to enjoy sual interests and activities. Married. Lives with husband. Has 3 children. Spending time with family.  Appetite adequate. Weight loss - 230 from  235 pounds - troubl eating and keeping food down. Sleeping better some nights than others. Averages 6 hours - up and down during the night. Unable to tolerate CPAP. Reports focus and concentration difficulties.  Completing tasks. Managing aspects of household. 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  Denies AH or VH. Denies self harm. Denies substance use.  Therapy on Wednesdays.  Previous medication trials: Denies  Review of Systems:  Review of Systems  Musculoskeletal:  Negative for gait problem.  Neurological:  Negative for tremors.  Psychiatric/Behavioral:         Please refer to HPI    Medications: I have reviewed the patient's current medications.  Current Outpatient Medications  Medication Sig Dispense Refill   Armodafinil  50 MG tablet TAKE 1 TABLET BY MOUTH EVERY DAY 30 tablet 2   aspirin  81 MG chewable tablet Chew 1 tablet (81 mg total) by mouth daily. 30 tablet 0   buPROPion  (WELLBUTRIN  XL) 150 MG 24 hr tablet Take 3 tablets (450 mg total) by mouth daily after breakfast. 270 tablet 3   diazepam  (VALIUM ) 5 MG tablet Take 1 tablet (5 mg total) by mouth 2 (two) times daily as needed. 30 tablet 2   escitalopram  (LEXAPRO ) 20 MG tablet Take 1 tablet (20 mg total) by mouth daily after breakfast. 90 tablet 3   HYDROcodone -acetaminophen  (NORCO) 5-325 MG tablet Take 1-2 tablets by  mouth every 6 (six) hours as needed. 15 tablet 0   HYDROmorphone  (DILAUDID ) 2 MG tablet Take 0.5 tablets (1 mg total) by mouth every 6 (six) hours as needed for severe pain. 10 tablet 0   lamoTRIgine  (LAMICTAL ) 200 MG tablet Take 2 tablets (400 mg total) by mouth at bedtime. 180 tablet 3    methylphenidate  (RITALIN ) 10 MG tablet Take 1 tablet (10 mg total) by mouth 2 (two) times daily with breakfast and lunch. 60 tablet 0   methylphenidate  (RITALIN ) 10 MG tablet Take 1 tablet (10 mg total) by mouth 3 (three) times daily. 90 tablet 0   methylphenidate  (RITALIN ) 10 MG tablet Take 1 tablet (10 mg total) by mouth 2 (two) times daily with breakfast and lunch. 60 tablet 0   predniSONE  (DELTASONE ) 10 MG tablet Take 2 tablets (20 mg total) by mouth 2 (two) times daily. 20 tablet 0   sertraline  (ZOLOFT ) 100 MG tablet Take 1 tablet (100 mg total) by mouth daily. 90 tablet 1   No current facility-administered medications for this visit.    Medication Side Effects: None  Allergies:  Allergies  Allergen Reactions   Tape Rash    Extended exposure    Past Medical History:  Diagnosis Date   Factor 5 Leiden mutation, heterozygous (HCC)    Stroke (HCC)     No family history on file.  Social History   Socioeconomic History   Marital status: Married    Spouse name: Not on file   Number of children: Not on file   Years of education: Not on file   Highest education level: Not on file  Occupational History   Not on file  Tobacco Use   Smoking status: Never   Smokeless tobacco: Never  Vaping Use   Vaping status: Never Used  Substance and Sexual Activity   Alcohol use: Not Currently   Drug use: Never   Sexual activity: Not on file  Other Topics Concern   Not on file  Social History Narrative   Not on file   Social Drivers of Health   Financial Resource Strain: Not on file  Food Insecurity: Low Risk  (01/25/2024)   Received from [facility]   Hunger Vital Sign    Within the past 12 months, you worried that your food would run out before you got money to buy more: Never true    Within the past 12 months, the food you bought just didn't last and you didn't have money to get more. : Never true  Transportation Needs: No Transportation Needs (01/25/2024)   Received from [employer]    In the past 12 months, has lack of reliable transportation kept you from medical appointments, meetings, work or from getting things needed for daily living? : No  Physical Activity: Insufficiently Active (07/20/2019)   Received from [facility] visits prior to 11/22/2022.   Exercise Vital Sign    On average, how many days per week do you engage in moderate to strenuous exercise (like a brisk walk)?: 2 days    On average, how many minutes do you engage in exercise at this level?: 20 min  Stress: No Stress Concern Present (07/20/2019)   Received from [facility] visits prior to 11/22/2022.   [employer] of Occupational [employer] - Occupational Stress Questionnaire    Feeling of Stress : Only a little  Social Connections: Unknown (01/28/2022)   Received from [facility]  Social Network    Social Network: Not on file  Intimate Partner Violence: Not At Risk (01/04/2024)   Received from [facility]   HITS    Over the last 12 months how often did your partner physically hurt you?: Never    Over the last 12 months how often did your partner insult you or talk down to you?: Never    Over the last 12 months how often did your partner threaten you with physical harm?: Never    Over the last 12 months how often did your partner scream or curse at you?: Never    Past Medical History, Surgical history, Social history, and Family history were reviewed and updated as appropriate.   Please see review of systems for further details on the patient's review from today.   Objective:   Physical Exam:  There were no vitals taken for this visit.  Physical Exam Constitutional:      General: She is not in acute distress. Musculoskeletal:        General: No deformity.  Neurological:     Mental Status: She is alert and oriented to person, place, and time.     Coordination: Coordination normal.  Psychiatric:        Attention and  Perception: Attention and perception normal. She does not perceive auditory or visual hallucinations.        Mood and Affect: Mood normal. Mood is not anxious or depressed. Affect is not labile, blunt, angry or inappropriate.        Speech: Speech normal.        Behavior: Behavior normal.        Thought Content: Thought content normal. Thought content is not paranoid or delusional. Thought content does not include homicidal or suicidal ideation. Thought content does not include homicidal or suicidal plan.        Cognition and Memory: Cognition and memory normal.        Judgment: Judgment normal.     Comments: Insight intact     Lab Review:     Component Value Date/Time   NA 137 08/30/2021 1128   K 4.4 08/30/2021 1128   CL 104 08/30/2021 1128   CO2 25 08/30/2021 1128   GLUCOSE 103 (H) 08/30/2021 1128   BUN 7 08/30/2021 1128   CREATININE 0.80 08/30/2021 1128   CALCIUM 8.8 (L) 08/30/2021 1128   PROT 6.7 08/30/2021 1128   ALBUMIN 3.7 08/30/2021 1128   AST 21 08/30/2021 1128   ALT 15 08/30/2021 1128   ALKPHOS 85 08/30/2021 1128   BILITOT 1.0 08/30/2021 1128   GFRNONAA >60 08/30/2021 1128   GFRAA >60 12/27/2019 2003       Component Value Date/Time   WBC 9.0 08/30/2021 1128   RBC 4.38 08/30/2021 1128   HGB 13.1 08/30/2021 1128   HCT 38.7 08/30/2021 1128   PLT 265 08/30/2021 1128   MCV 88.4 08/30/2021 1128   MCH 29.9 08/30/2021 1128   MCHC 33.9 08/30/2021 1128   RDW 12.9 08/30/2021 1128   LYMPHSABS 2.2 08/30/2021 1128   MONOABS 0.5 08/30/2021 1128   EOSABS 0.2 08/30/2021 1128   BASOSABS 0.0 08/30/2021 1128    No results found for: POCLITH, LITHIUM   No results found for: PHENYTOIN, PHENOBARB, VALPROATE, CBMZ   .res Assessment: Plan:   Plan:  PDMP reviewed  Increase Zoloft  50mg  to 100mg  daily  Lamictal  200 mg (2 tablets) every bedtime   RTC 3 months or sooner if needed - upcoming surgery.  20 minutes spent dedicated to the care of this patient on  the date of this encounter to include pre-visit review of records, ordering of medication, post visit documentation, and face-to-face time with the patient discussing BPD-1, OSA, GAD, secondary neurodevelopmental disorder. Patient reports previous medications were discontinued while hospitalized. She is currently taking Lamictal  200mg  BID, but does not feel like it's helping to manage mood symptoms. She is willing to increase dose of Zoloft  for mood symptoms. Patient advised to contact office with any questions, adverse effects, or acute worsening in signs and symptoms.  Discussed potential benefits, risk, and side effects of benzodiazepines to include potential risk of tolerance and dependence, as well as possible drowsiness.  Advised patient not to drive if experiencing drowsiness and to take lowest possible effective dose to minimize risk of dependence and tolerance.   Discussed potential metabolic side effects associated with atypical antipsychotics, as well as potential risk for movement side effects. Advised pt to contact office if movement side effects occur.    Counseled patient regarding potential benefits, risks, and side effects of Lamictal  to include potential risk of Stevens-Johnson syndrome. Advised patient to stop taking Lamictal  and contact office immediately if rash develops and to seek urgent medical attention if rash is severe and/or spreading quickly.   Diagnoses and all orders for this visit:  Bipolar I disorder, moderate, current or most recent episode depressed, with anxious distress (HCC) -     sertraline  (ZOLOFT ) 100 MG tablet; Take 1 tablet (100 mg total) by mouth daily.  Secondary neurodevelopmental disorder -     sertraline  (ZOLOFT ) 100 MG tablet; Take 1 tablet (100 mg total) by mouth daily.  Generalized anxiety disorder -     sertraline  (ZOLOFT ) 100 MG tablet; Take 1 tablet (100 mg total) by mouth daily.     Please see After Visit Summary for patient specific  instructions.  No future appointments.   No orders of the defined types were placed in this encounter.     -------------------------------

## 2024-09-06 ENCOUNTER — Telehealth: Payer: Self-pay | Admitting: Adult Health

## 2024-09-06 DIAGNOSIS — F411 Generalized anxiety disorder: Secondary | ICD-10-CM

## 2024-09-06 DIAGNOSIS — F88 Other disorders of psychological development: Secondary | ICD-10-CM

## 2024-09-06 DIAGNOSIS — F3132 Bipolar disorder, current episode depressed, moderate: Secondary | ICD-10-CM

## 2024-09-06 MED ORDER — SERTRALINE HCL 100 MG PO TABS: 100.0000 mg | ORAL_TABLET | Freq: Every day | ORAL | 0 refills | Status: AC

## 2024-09-06 NOTE — Telephone Encounter (Signed)
 Pt called reporting traveling out of state and left her med bag.Requesting Rx for #7 of Sertraline  100 mg 1/d to [facility] [address] [place] [address]   Phone # [phone]. Advised will be out of pocket. Call pt @ [phone] if needed. Pt will schedule f/u when back in town.

## 2024-09-06 NOTE — Telephone Encounter (Signed)
Rx sent and patient notified.

## 2024-10-16 ENCOUNTER — Other Ambulatory Visit: Payer: Self-pay | Admitting: Adult Health

## 2024-10-16 DIAGNOSIS — F88 Other disorders of psychological development: Secondary | ICD-10-CM

## 2024-10-16 DIAGNOSIS — F3132 Bipolar disorder, current episode depressed, moderate: Secondary | ICD-10-CM

## 2024-10-16 DIAGNOSIS — F411 Generalized anxiety disorder: Secondary | ICD-10-CM

## 2024-10-17 NOTE — Telephone Encounter (Signed)
 Past due for FU. Sent MyChart message to schedule and asked if she is still taking Lexapro . Do not see discontinued in notes.

## 2024-10-20 ENCOUNTER — Other Ambulatory Visit: Payer: Self-pay | Admitting: Adult Health

## 2024-10-20 DIAGNOSIS — F411 Generalized anxiety disorder: Secondary | ICD-10-CM

## 2024-10-20 DIAGNOSIS — G4733 Obstructive sleep apnea (adult) (pediatric): Secondary | ICD-10-CM

## 2024-10-20 DIAGNOSIS — F3132 Bipolar disorder, current episode depressed, moderate: Secondary | ICD-10-CM

## 2024-10-20 DIAGNOSIS — F88 Other disorders of psychological development: Secondary | ICD-10-CM

## 2024-10-20 NOTE — Telephone Encounter (Signed)
 Needs FU, was due in Oct. Have sent a MyChart msg and sent note to Admin to schedule FU.

## 2024-10-20 NOTE — Telephone Encounter (Signed)
 Please call to schedule FU, did not respond to MyChart msg.

## 2024-10-27 NOTE — Telephone Encounter (Signed)
 Patient returned call. She reports she lost her job and insurance in December. She has insurance thru [employer] and they do not accept [place] mutual life. She said she called a [employer] thru her insurance and got RF on her medications. She said when she gets a new job and insurance will contact us  to continue FU. Does not need any RF currently.

## 2024-10-27 NOTE — Telephone Encounter (Signed)
 LVM to RC to schedule FU.

## 2024-10-28 ENCOUNTER — Emergency Department (HOSPITAL_BASED_OUTPATIENT_CLINIC_OR_DEPARTMENT_OTHER)
Admission: EM | Admit: 2024-10-28 | Source: Home / Self Care | Attending: Emergency Medicine | Admitting: Emergency Medicine

## 2024-10-28 ENCOUNTER — Other Ambulatory Visit: Payer: Self-pay

## 2024-10-28 ENCOUNTER — Emergency Department (HOSPITAL_COMMUNITY)

## 2024-10-28 ENCOUNTER — Encounter (HOSPITAL_BASED_OUTPATIENT_CLINIC_OR_DEPARTMENT_OTHER): Payer: Self-pay

## 2024-10-28 DIAGNOSIS — R2681 Unsteadiness on feet: Secondary | ICD-10-CM

## 2024-10-28 DIAGNOSIS — R42 Dizziness and giddiness: Secondary | ICD-10-CM

## 2024-10-28 DIAGNOSIS — R531 Weakness: Secondary | ICD-10-CM

## 2024-10-28 DIAGNOSIS — R69 Illness, unspecified: Secondary | ICD-10-CM

## 2024-10-28 LAB — CBC WITH DIFFERENTIAL/PLATELET
Abs Immature Granulocytes: 0.02 10*3/uL (ref 0.00–0.07)
Basophils Absolute: 0 10*3/uL (ref 0.0–0.1)
Basophils Relative: 0 %
Eosinophils Absolute: 0.2 10*3/uL (ref 0.0–0.5)
Eosinophils Relative: 3 %
HCT: 39 % (ref 36.0–46.0)
Hemoglobin: 13.1 g/dL (ref 12.0–15.0)
Immature Granulocytes: 0 %
Lymphocytes Relative: 35 %
Lymphs Abs: 2 10*3/uL (ref 0.7–4.0)
MCH: 28.7 pg (ref 26.0–34.0)
MCHC: 33.6 g/dL (ref 30.0–36.0)
MCV: 85.5 fL (ref 80.0–100.0)
Monocytes Absolute: 0.3 10*3/uL (ref 0.1–1.0)
Monocytes Relative: 6 %
Neutro Abs: 3.2 10*3/uL (ref 1.7–7.7)
Neutrophils Relative %: 56 %
Platelets: 261 10*3/uL (ref 150–400)
RBC: 4.56 MIL/uL (ref 3.87–5.11)
RDW: 13.5 % (ref 11.5–15.5)
WBC: 5.8 10*3/uL (ref 4.0–10.5)
nRBC: 0 % (ref 0.0–0.2)

## 2024-10-28 LAB — BASIC METABOLIC PANEL WITH GFR
Anion gap: 13 (ref 5–15)
BUN: 18 mg/dL (ref 6–20)
CO2: 23 mmol/L (ref 22–32)
Calcium: 9.4 mg/dL (ref 8.9–10.3)
Chloride: 103 mmol/L (ref 98–111)
Creatinine, Ser: 1.06 mg/dL — ABNORMAL HIGH (ref 0.44–1.00)
GFR, Estimated: >60 mL/min
Glucose, Bld: 99 mg/dL (ref 70–99)
Potassium: 4.9 mmol/L (ref 3.5–5.1)
Sodium: 139 mmol/L (ref 135–145)

## 2024-10-28 LAB — URINALYSIS, ROUTINE W REFLEX MICROSCOPIC
Bilirubin Urine: NEGATIVE
Glucose, UA: NEGATIVE mg/dL
Hgb urine dipstick: NEGATIVE
Ketones, ur: NEGATIVE mg/dL
Leukocytes,Ua: NEGATIVE
Nitrite: NEGATIVE
Protein, ur: NEGATIVE mg/dL
Specific Gravity, Urine: 1.015 (ref 1.005–1.030)
pH: 6 (ref 5.0–8.0)

## 2024-10-28 LAB — CBG MONITORING, ED: Glucose-Capillary: 103 mg/dL — ABNORMAL HIGH (ref 70–99)

## 2024-10-28 LAB — MAGNESIUM: Magnesium: 2.3 mg/dL (ref 1.7–2.4)

## 2024-10-28 MED ORDER — ACETAMINOPHEN 500 MG PO TABS
1000.0000 mg | ORAL_TABLET | Freq: Once | ORAL | Status: AC
  Administered 2024-10-28: 1000 mg via ORAL
  Filled 2024-10-28: qty 2

## 2024-10-28 NOTE — ED Notes (Signed)
 Patient departed [facility] ED with [facility] at this time.

## 2024-10-28 NOTE — ED Provider Notes (Signed)
 " [place] EM[facility] AT [facility] Provider Note   CSN: [id number] Arrival date & time: 10/28/24  1428     Patient presents with: Dizziness   Madison Henry is a 54 y.o. female.   HPI  Patient reports that she awakened this morning and felt a type of dizziness that she has never felt before.  She reports it was like the dizziness was behind her eyes, similar to when you are a kid and you spin around and feel dizzy.  She also felt nauseated.  She denies severe headache.  Just some pressure behind the eyes.  No actual visual disturbance.  Patient denies any prior diagnosis of vertigo.  She reports when she awakened she tried to walk and felt like her legs were crossing one in front of the other and she could not get them organized, causing her to stumble and be unsteady.  The patient reports that she took a meclizine and a Zofran , went back to sleep for several hours and then when she woke up the symptoms were improved.  She did not have the persisting spinning dizziness and was able to walk closer to her baseline.  At baseline, patient reports she has a left-sided facial droop and left-sided arm weakness.  She qualifies her left-sided facial droop as being her baseline, she feels that there is some persisting heavy sensation that is not normal for her on the left side.  Although the patient had meclizine to take, she denies a history of vertigo.  She reports she had the meclizine due to misdiagnosis of persistent cervical injury, which left her gait severely impaired and headaches and dizziness.  EMR review of the note by Dr. Ezzie Slater Root at [facility] system PMHxas follows:Madison Henry is a 54 y/o RH female with h/o HTN, HL, OSA, bipolar type 1, GAD, impaired fasting glucose, stroke as child with residual left sShe did see Dr. Evalene Blizzard at [facility] for her neck pain. She had ACDF C4-7 in February of 2023. She saw Dr. Blizzard and his note documents  tingling in her arms and some yelopathic changes on exam thus he recommended posterior cervical decompression C7-T1 and fixation and fusion with instrumentation from C3-T2. She had surgery 05/19/2024. His note documents some improvement in dizziness, nausea and fall risk. Imaging demonstrates ACDF C4-7. Posterior decompression and fusion spanning C3-T2. ided impaired mobility, DVTs, factor V leiden.  She had blood clots 20 years ago with birth control a long flight.  She does have factor V Leiden but has seen hematology and not taking aspirin  or anticoagulant.     Prior to Admission medications  Medication Sig Start Date End Date Taking? Authorizing Provider  atorvastatin (LIPITOR) 80 MG tablet Take 80 mg by mouth daily. 10/21/22  Yes [provider]  cyanocobalamin (VITAMIN B12) 1000 MCG/ML injection Inject 1,000 mcg into the muscle every 30 (thirty) days. 10/19/24  Yes [provider]  folic acid (FOLVITE) 1 MG tablet Take 1 mg by mouth daily. 10/19/24 10/19/25 Yes [provider]  moxifloxacin (VIGAMOX) 0.5 % ophthalmic solution Apply 1 drop to eye 3 (three) times daily. 10/20/24  Yes [provider]  traZODone (DESYREL) 50 MG tablet Take 50 mg by mouth at bedtime. 10/16/24  Yes [provider]  Vitamin D, Ergocalciferol, (DRISDOL) 1.25 MG (50000 UNIT) CAPS capsule Take 50,000 Units by mouth every 7 (seven) days. 10/19/24 12/14/24 Yes [provider]  Armodafinil  50 MG tablet TAKE 1 TABLET BY  MOUTH EVERY DAY 12/14/23   Henry, Madison Nattalie, NP  aspirin  81 MG chewable tablet Chew 1 tablet (81 mg total) by mouth daily. 10/22/20   Horton, Kristie M, DO  buPROPion  (WELLBUTRIN  XL) 150 MG 24 hr tablet Take 3 tablets (450 mg total) by mouth daily after breakfast. 08/03/23   Henry, Madison Nattalie, NP  diazepam  (VALIUM ) 5 MG tablet Take 1 tablet (5 mg total) by mouth 2 (two) times daily as needed. 08/03/23   Henry, Madison Nattalie, NP  escitalopram   (LEXAPRO ) 20 MG tablet Take 1 tablet (20 mg total) by mouth daily after breakfast. 08/03/23   Henry, Madison Nattalie, NP  HYDROcodone -acetaminophen  (NORCO) 5-325 MG tablet Take 1-2 tablets by mouth every 6 (six) hours as needed. 03/20/19   Geroldine Berg, MD  HYDROmorphone  (DILAUDID ) 2 MG tablet Take 0.5 tablets (1 mg total) by mouth every 6 (six) hours as needed for severe pain. 12/27/19   Long, Joshua G, MD  lamoTRIgine  (LAMICTAL ) 200 MG tablet Take 2 tablets (400 mg total) by mouth at bedtime. 08/03/23   Henry, Madison Nattalie, NP  lisinopril (ZESTRIL) 10 MG tablet Take 10 mg by mouth daily.    [provider]  methylphenidate  (RITALIN ) 10 MG tablet Take 1 tablet (10 mg total) by mouth 2 (two) times daily with breakfast and lunch. 12/21/23 01/20/24  Henry, Madison Nattalie, NP  methylphenidate  (RITALIN ) 10 MG tablet Take 1 tablet (10 mg total) by mouth 3 (three) times daily. 01/18/24 02/17/24  Henry, Madison Nattalie, NP  methylphenidate  (RITALIN ) 10 MG tablet Take 1 tablet (10 mg total) by mouth 2 (two) times daily with breakfast and lunch. 02/15/24 03/16/24  Henry, Madison Nattalie, NP  predniSONE  (DELTASONE ) 10 MG tablet Take 2 tablets (20 mg total) by mouth 2 (two) times daily. 03/20/19   Geroldine Berg, MD  sertraline  (ZOLOFT ) 100 MG tablet Take 1 tablet (100 mg total) by mouth daily. 09/06/24   Henry, Madison Nattalie, NP    Allergies: Tape    Review of Systems  Updated Vital Signs BP 129/89   Pulse 70   Temp 98.2 F (36.8 C) (Oral)   Resp 16   Ht 5' 5 (1.651 m)   Wt 100.9 kg   SpO2 95%   BMI 37.02 kg/m   Physical Exam Constitutional:      Comments: Patient is alert.  She does not appear in any acute distress.  No respiratory distress.  HENT:     Head: Normocephalic and atraumatic.     Right Ear: Tympanic membrane normal.     Left Ear: Tympanic membrane normal.     Ears:     Comments: Bilateral ear canals clear and TMs normal.    Nose: Nose normal.      Mouth/Throat:     Mouth: Mucous membranes are moist.     Pharynx: Oropharynx is clear.  Eyes:     Extraocular Movements: Extraocular movements intact.     Pupils: Pupils are equal, round, and reactive to light.  Neck:     Comments: Patient has had multiple cervical surgeries and fixations.  Anterior neck is soft and no lymphadenopathy.  She can move her head but certainly limitations due to prior surgical surgeries. Cardiovascular:     Rate and Rhythm: Normal rate and regular rhythm.  Pulmonary:     Effort: Pulmonary effort is normal.     Breath sounds: Normal breath sounds.  Abdominal:     General: There is no distension.     Palpations: Abdomen  is soft.  Musculoskeletal:     Cervical back: Neck supple.     Comments: Patient has some pre-existing left-sided weakness and appearance of mild contractures due to spasticity and chronic weakness.  She appears to have mild plantar extension LLE.  No significant peripheral edema.  Skin:    General: Skin is warm and dry.  Neurological:     Comments: Patient is alert and oriented.  Cognitive function intact.  Recall intact.  She has some slight speech anomaly and left-sided facial droop which are reportedly chronic in nature.  She is reporting that there is some decrease in her sensation as I touch the left side of the face.  To her it feels different than what she considers her baseline.  Extraocular motions are intact.  I really do not appreciate nystagmus although possibly very subtle to the left, definitely not pronounced.  Patient performed grip strength with both her right and left upper extremity.  She reports that they work in unison so when she performs her grip on the right that the left functions better.  Generally speaking right is 5\5 and left is 4\5.  She seems to have some ataxia and decreased control over the left upper extremity.  This is about the equivalent for the lower extremities.  She can raise the right extremity fairly well and  hold against resistance.  She is also able to raise the left lower extremity but it seems slightly ataxic and mildly weaker than the right.  Psychiatric:        Mood and Affect: Mood normal.     (all labs ordered are listed, but only abnormal results are displayed) Labs Reviewed  BASIC METABOLIC PANEL WITH GFR - Abnormal; Notable for the following components:      Result Value   Creatinine, Ser 1.06 (*)    All other components within normal limits  CBG MONITORING, ED - Abnormal; Notable for the following components:   Glucose-Capillary 103 (*)    All other components within normal limits  CBC WITH DIFFERENTIAL/PLATELET  MAGNESIUM  URINALYSIS, ROUTINE W REFLEX MICROSCOPIC    EKG: EKG Interpretation Date/Time:  Friday October 28 2024 14:40:43 EST Ventricular Rate:  92 PR Interval:  168 QRS Duration:  96 QT Interval:  353 QTC Calculation: 437 R Axis:   80  Text Interpretation: Sinus rhythm Borderline T abnormalities, diffuse leads Confirmed by Ula Barter [phone]) on 10/28/2024 2:55:28 PM  Radiology: No results found.   Procedures   Medications Ordered in the ED - No data to display                                  Medical Decision Making Amount and/or Complexity of Data Reviewed Labs: ordered. Radiology: ordered.   Patient presents as outlined.  I have reviewed the EMR and there is a complex history with pre-existing left-sided weakness, gait disturbance, falls, dizziness with prior cervical fixations and very distant stroke as a child reportedly accounting for the pre-existing left-sided symptoms.  Today the patient reports that she perceives dizziness that she experienced today to be different than any she has ever experienced previously and she feels that her left-sided heaviness is not typical for her.  Patient did not have sudden or severe headache.  Symptoms did sound fairly typical for peripheral vertigo with classic vertiginous quality and improvement after  taking meclizine at home.  Urinalysis negative.  Magnesium 2.3.  CBC normal  with normal differential.  Basic metabolic panel normal with creatinine of 1.06 GFR greater than 60 potassium normal at 4.9 CBG 103.  Consult: Reviewed with on-call [facility] neurology Dr.Ludjke.  We reviewed patient's history of present illness, medical history.  Certainly patient has many symptoms that were previously present, they will be able to expedite follow-up, if the patient's symptoms seem different or atypical for her we will have to consider MRI based on current presentation to the ED.  I reassessed the patient, we discussed expeditious follow-up and the patient remained very concerned that the left-sided heaviness she was experiencing did not seem typical for her.  She asked me several times why I thought she had heaviness and gait disturbance that were not her baseline.  She felt that the left side heaviness had persisted despite the fact that the vertigo had improved after meclizine and rest.  Ultimately, with patient concern and report of symptoms different from baseline, she does want to proceed with MRI to make sure there is no new stroke.  I cannot definitively rule out this possibility based on the clinical exam.  We will proceed with transfer to [facility] for MRI to rule out any acute findings.  If negative for acute findings, her presenting complaint and description are very suggestive of peripheral vertigo and likely appropriate to continue meclizine and Zofran  as needed with expedited follow-up with her neurologist.  Dr. Cottie EDP at [facility] accepts for ED to ED transfer      Final diagnoses:  Vertigo  Left-sided weakness  Gait instability  Severe comorbid illness    ED Discharge Orders     None          Armenta Canning, MD 10/28/24 1921  "

## 2024-10-28 NOTE — ED Notes (Signed)
 Patient reports noting a dime-sized blood clot on her toilet paper after urinating/wiping. Provider notified.

## 2024-10-28 NOTE — ED Provider Notes (Incomplete Revision)
 " [place] EM[facility] Provider Note   CSN: [id number] Arrival date & time: 10/28/24  1428     Patient presents with: Dizziness   Madison Henry is a 54 y.o. female.   HPI  Patient reports that she awakened this morning and felt a type of dizziness that she has never felt before.  She reports it was like the dizziness was behind her eyes, similar to when you are a kid and you spin around and feel dizzy.  She also felt nauseated.  She denies severe headache.  Just some pressure behind the eyes.  No actual visual disturbance.  Patient denies any prior diagnosis of vertigo.  She reports when she awakened she tried to walk and felt like her legs were crossing one in front of the other and she could not get them organized, causing her to stumble and be unsteady.  The patient reports that she took a meclizine and a Zofran , went back to sleep for several hours and then when she woke up the symptoms were improved.  She did not have the persisting spinning dizziness and was able to walk closer to her baseline.  At baseline, patient reports she has a left-sided facial droop and left-sided arm weakness.  She qualifies her left-sided facial droop as being her baseline, she feels that there is some persisting heavy sensation that is not normal for her on the left side.  Although the patient had meclizine to take, she denies a history of vertigo.  She reports she had the meclizine due to misdiagnosis of persistent cervical injury, which left her gait severely impaired and headaches and dizziness.  EMR review of the note by Dr. 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 Evalene Blizzard at [facility] for her neck pain. She had ACDF C4-7 in February of 2023. She saw Dr. Blizzard and his note documents  tingling in her arms and some yelopathic changes on exam thus he recommended posterior cervical decompression C7-T1 and fixation and fusion with instrumentation from C3-T2. She had surgery 05/19/2024. His note documents some improvement in dizziness, nausea and fall risk. Imaging demonstrates ACDF C4-7. Posterior decompression and fusion spanning C3-T2. ided impaired mobility, DVTs, factor V leiden.  She had blood clots 20 years ago with birth control a long flight.  She does have factor V Leiden but has seen hematology and not taking aspirin  or anticoagulant.     Prior to Admission medications  Medication Sig Start Date End Date Taking? Authorizing Provider  atorvastatin (LIPITOR) 80 MG tablet Take 80 mg by mouth daily. 10/21/22  Yes [provider]  cyanocobalamin (VITAMIN B12) 1000 MCG/ML injection Inject 1,000 mcg into the muscle every 30 (thirty) days. 10/19/24  Yes [provider]  folic acid (FOLVITE) 1 MG tablet Take 1 mg by mouth daily. 10/19/24 10/19/25 Yes [provider]  moxifloxacin (VIGAMOX) 0.5 % ophthalmic solution Apply 1 drop to eye 3 (three) times daily. 10/20/24  Yes [provider]  traZODone (DESYREL) 50 MG tablet Take 50 mg by mouth at bedtime. 10/16/24  Yes [provider]  Vitamin D, Ergocalciferol, (DRISDOL) 1.25 MG (50000 UNIT) CAPS capsule Take 50,000 Units by mouth every 7 (seven) days. 10/19/24 12/14/24 Yes [provider]  Armodafinil  50 MG tablet TAKE 1 TABLET BY  MOUTH EVERY DAY 12/14/23   Mozingo, Regina Nattalie, NP  aspirin  81 MG chewable tablet Chew 1 tablet (81 mg total) by mouth daily. 10/22/20   Horton, Kristie M, DO  buPROPion  (WELLBUTRIN  XL) 150 MG 24 hr tablet Take 3 tablets (450 mg total) by mouth daily after breakfast. 08/03/23   Mozingo, Regina Nattalie, NP  diazepam  (VALIUM ) 5 MG tablet Take 1 tablet (5 mg total) by mouth 2 (two) times daily as needed. 08/03/23   Mozingo, Regina Nattalie, NP  escitalopram   (LEXAPRO ) 20 MG tablet Take 1 tablet (20 mg total) by mouth daily after breakfast. 08/03/23   Mozingo, Regina Nattalie, NP  HYDROcodone -acetaminophen  (NORCO) 5-325 MG tablet Take 1-2 tablets by mouth every 6 (six) hours as needed. 03/20/19   Geroldine Berg, MD  HYDROmorphone  (DILAUDID ) 2 MG tablet Take 0.5 tablets (1 mg total) by mouth every 6 (six) hours as needed for severe pain. 12/27/19   Long, Fonda MATSU, MD  lamoTRIgine  (LAMICTAL ) 200 MG tablet Take 2 tablets (400 mg total) by mouth at bedtime. 08/03/23   Mozingo, Regina Nattalie, NP  lisinopril (ZESTRIL) 10 MG tablet Take 10 mg by mouth daily.    [provider]  methylphenidate  (RITALIN ) 10 MG tablet Take 1 tablet (10 mg total) by mouth 2 (two) times daily with breakfast and lunch. 12/21/23 01/20/24  Mozingo, Regina Nattalie, NP  methylphenidate  (RITALIN ) 10 MG tablet Take 1 tablet (10 mg total) by mouth 3 (three) times daily. 01/18/24 02/17/24  Mozingo, Regina Nattalie, NP  methylphenidate  (RITALIN ) 10 MG tablet Take 1 tablet (10 mg total) by mouth 2 (two) times daily with breakfast and lunch. 02/15/24 03/16/24  Mozingo, Regina Nattalie, NP  predniSONE  (DELTASONE ) 10 MG tablet Take 2 tablets (20 mg total) by mouth 2 (two) times daily. 03/20/19   Geroldine Berg, MD  sertraline  (ZOLOFT ) 100 MG tablet Take 1 tablet (100 mg total) by mouth daily. 09/06/24   Mozingo, Regina Nattalie, NP    Allergies: Tape    Review of Systems  Updated Vital Signs BP 129/89   Pulse 70   Temp 98.2 F (36.8 C) (Oral)   Resp 16   Ht 5' 5 (1.651 m)   Wt 100.9 kg   SpO2 95%   BMI 37.02 kg/m   Physical Exam Constitutional:      Comments: Patient is alert.  She does not appear in any acute distress.  No respiratory distress.  HENT:     Head: Normocephalic and atraumatic.     Right Ear: Tympanic membrane normal.     Left Ear: Tympanic membrane normal.     Ears:     Comments: Bilateral ear canals clear and TMs normal.    Nose: Nose normal.      Mouth/Throat:     Mouth: Mucous membranes are moist.     Pharynx: Oropharynx is clear.  Eyes:     Extraocular Movements: Extraocular movements intact.     Pupils: Pupils are equal, round, and reactive to light.  Neck:     Comments: Patient has had multiple cervical surgeries and fixations.  Anterior neck is soft and no lymphadenopathy.  She can move her head but certainly limitations due to prior surgical surgeries. Cardiovascular:     Rate and Rhythm: Normal rate and regular rhythm.  Pulmonary:     Effort: Pulmonary effort is normal.     Breath sounds: Normal breath sounds.  Abdominal:     General: There is no distension.     Palpations: Abdomen  is soft.  Musculoskeletal:     Cervical back: Neck supple.     Comments: Patient has some pre-existing left-sided weakness and appearance of mild contractures due to spasticity and chronic weakness.  She appears to have mild plantar extension LLE.  No significant peripheral edema.  Skin:    General: Skin is warm and dry.  Neurological:     Comments: Patient is alert and oriented.  Cognitive function intact.  Recall intact.  She has some slight speech anomaly and left-sided facial droop which are reportedly chronic in nature.  She is reporting that there is some decrease in her sensation as I touch the left side of the face.  To her it feels different than what she considers her baseline.  Extraocular motions are intact.  I really do not appreciate nystagmus although possibly very subtle to the left, definitely not pronounced.  Patient performed grip strength with both her right and left upper extremity.  She reports that they work in unison so when she performs her grip on the right that the left functions better.  Generally speaking right is 5\5 and left is 4\5.  She seems to have some ataxia and decreased control over the left upper extremity.  This is about the equivalent for the lower extremities.  She can raise the right extremity fairly well and  hold against resistance.  She is also able to raise the left lower extremity but it seems slightly ataxic and mildly weaker than the right.  Psychiatric:        Mood and Affect: Mood normal.     (all labs ordered are listed, but only abnormal results are displayed) Labs Reviewed  BASIC METABOLIC PANEL WITH GFR - Abnormal; Notable for the following components:      Result Value   Creatinine, Ser 1.06 (*)    All other components within normal limits  CBG MONITORING, ED - Abnormal; Notable for the following components:   Glucose-Capillary 103 (*)    All other components within normal limits  CBC WITH DIFFERENTIAL/PLATELET  MAGNESIUM  URINALYSIS, ROUTINE W REFLEX MICROSCOPIC    EKG: EKG Interpretation Date/Time:  Friday October 28 2024 14:40:43 EST Ventricular Rate:  92 PR Interval:  168 QRS Duration:  96 QT Interval:  353 QTC Calculation: 437 R Axis:   80  Text Interpretation: Sinus rhythm Borderline T abnormalities, diffuse leads Confirmed by Ula Barter [phone]) on 10/28/2024 2:55:28 PM  Radiology: No results found.   Procedures   Medications Ordered in the ED - No data to display                                  Medical Decision Making Amount and/or Complexity of Data Reviewed Labs: ordered. Radiology: ordered.  Risk OTC drugs.   Patient presents as outlined.  I have reviewed the EMR and there is a complex history with pre-existing left-sided weakness, gait disturbance, falls, dizziness with prior cervical fixations and very distant stroke as a child reportedly accounting for the pre-existing left-sided symptoms.  Today the patient reports that she perceives dizziness that she experienced today to be different than any she has ever experienced previously and she feels that her left-sided heaviness is not typical for her.  Patient did not have sudden or severe headache.  Symptoms did sound fairly typical for peripheral vertigo with classic vertiginous quality and  improvement after taking meclizine at home.  Urinalysis negative.  Magnesium  2.3.  CBC normal with normal differential.  Basic metabolic panel normal with creatinine of 1.06 GFR greater than 60 potassium normal at 4.9 CBG 103.  Consult: Reviewed with on-call [facility] neurology Dr.Ludjke.  We reviewed patient's history of present illness, medical history.  Certainly patient has many symptoms that were previously present, they will be able to expedite follow-up, if the patient's symptoms seem different or atypical for her we will have to consider MRI based on current presentation to the ED.  I reassessed the patient, we discussed expeditious follow-up and the patient remained very concerned that the left-sided heaviness she was experiencing did not seem typical for her.  She asked me several times why I thought she had heaviness and gait disturbance that were not her baseline.  She felt that the left side heaviness had persisted despite the fact that the vertigo had improved after meclizine and rest.  Ultimately, with patient concern and report of symptoms different from baseline, she does want to proceed with MRI to make sure there is no new stroke.  I cannot definitively rule out this possibility based on the clinical exam.  We will proceed with transfer to [facility] for MRI to rule out any acute findings.  If negative for acute findings, her presenting complaint and description are very suggestive of peripheral vertigo and likely appropriate to continue meclizine and Zofran  as needed with expedited follow-up with her neurologist.  Dr. Cottie EDP at [facility] accepts for ED to ED transfer      Final diagnoses:  Vertigo  Left-sided weakness  Gait instability  Severe comorbid illness    ED Discharge Orders     None          Armenta Canning, MD 10/28/24 1921  "

## 2024-10-28 NOTE — ED Notes (Signed)
 Called lab to run BMP, CBC, and magnesium off of blood samples sent to lab previously. Per lab, they will put this into process now.

## 2024-10-28 NOTE — ED Notes (Signed)
 Patient ambulated to and from restroom independently and without assistance.

## 2024-10-28 NOTE — ED Notes (Addendum)
 Patient reports symptoms x few weeks with symptoms worsening this morning. Patient was seen at neurologist 1 week ago for difficulty walking and diagnosed with ataxia. Outpatient MRI scheduled. Patient reports increased difficulty with walking this morning as well as a left sided headache radiating down to face and causing left facial numbness. Patient has a chronic left facial droop and left arm weakness/ataxia/contracture sp previous stroke. Denies anticoagulation.

## 2024-10-28 NOTE — ED Notes (Signed)
 [facility] called for transport.

## 2024-10-28 NOTE — ED Notes (Signed)
 Patient transferred from waiting room to ED treatment room. Assuming pt care at this time.

## 2024-10-28 NOTE — ED Triage Notes (Signed)
 Dizziness, L arm numbness, L sided facial droop.   States has a history of stroke and L arm pain, L sided facial droop and dizziness is normal at baseline but symptoms have worsened. Also notes stumbling words and difficulty finding words to use [facility]KW 2100 last night.   A&Ox4

## 2024-10-28 NOTE — ED Notes (Signed)
 MD verbal order not to activate code stroke, MD stated he would place orders for patient.

## 2024-10-28 NOTE — ED Provider Notes (Incomplete)
 Blood pressure 115/79, pulse 69, temperature 98.4 F (36.9 C), temperature source Oral, resp. rate 14, height 5' 5 (1.651 m), weight 100.9 kg, SpO2 96%.  Assuming care from Dr. Armenta.  In short, Madison Henry is a 54 y.o. female with a chief complaint of Dizziness .  Refer to the original H&P for additional details.  The current plan of care is to f/u on MRI brain.

## 2024-10-28 NOTE — ED Notes (Signed)
"  MD in triage  "

## 2024-10-28 NOTE — ED Notes (Signed)
 Called [facility] at 6:10 for follow up they states they are waiting on nuerologist to call back sometimes they are so busy they take long time to return ca
